# Patient Record
Sex: Female | Born: 1955 | Race: White | Hispanic: No | Marital: Single | Smoking: Former smoker
Health system: Southern US, Community
[De-identification: ages and names within clinical notes are randomized; demographics above are authoritative.]

## PROBLEM LIST (undated history)

## (undated) DIAGNOSIS — I10 Essential (primary) hypertension: Secondary | ICD-10-CM

## (undated) DIAGNOSIS — M1990 Unspecified osteoarthritis, unspecified site: Secondary | ICD-10-CM

## (undated) DIAGNOSIS — Z9289 Personal history of other medical treatment: Secondary | ICD-10-CM

## (undated) DIAGNOSIS — J309 Allergic rhinitis, unspecified: Secondary | ICD-10-CM

## (undated) DIAGNOSIS — M8580 Other specified disorders of bone density and structure, unspecified site: Secondary | ICD-10-CM

## (undated) HISTORY — DX: Allergic rhinitis, unspecified: J30.9

## (undated) HISTORY — DX: Other specified disorders of bone density and structure, unspecified site: M85.80

## (undated) HISTORY — DX: Unspecified osteoarthritis, unspecified site: M19.90

## (undated) HISTORY — DX: Personal history of other medical treatment: Z92.89

## (undated) HISTORY — PX: HAND SURGERY: SHX662

## (undated) HISTORY — PX: APPENDECTOMY: SHX54

## (undated) HISTORY — DX: Essential (primary) hypertension: I10

---

## 1980-03-31 HISTORY — PX: AUGMENTATION MAMMAPLASTY: SUR837

## 2001-03-31 HISTORY — PX: BILATERAL SALPINGOOPHORECTOMY: SHX1223

## 2001-03-31 HISTORY — PX: HYSTEROSCOPY: SHX211

## 2004-03-31 HISTORY — PX: LUMBAR DISC SURGERY: SHX700

## 2004-05-28 ENCOUNTER — Ambulatory Visit: Payer: Self-pay | Admitting: Specialist

## 2004-06-28 ENCOUNTER — Ambulatory Visit (HOSPITAL_COMMUNITY): Admission: RE | Admit: 2004-06-28 | Discharge: 2004-06-29 | Payer: Self-pay | Admitting: Neurological Surgery

## 2004-07-30 ENCOUNTER — Encounter: Admission: RE | Admit: 2004-07-30 | Discharge: 2004-07-30 | Payer: Self-pay | Admitting: Neurological Surgery

## 2004-09-03 ENCOUNTER — Encounter: Admission: RE | Admit: 2004-09-03 | Discharge: 2004-09-03 | Payer: Self-pay | Admitting: Neurological Surgery

## 2005-03-01 ENCOUNTER — Ambulatory Visit: Payer: Self-pay | Admitting: Internal Medicine

## 2005-10-27 ENCOUNTER — Ambulatory Visit: Payer: Self-pay | Admitting: Internal Medicine

## 2006-03-31 HISTORY — PX: SHOULDER ARTHROSCOPY: SHX128

## 2006-11-23 ENCOUNTER — Emergency Department: Payer: Self-pay | Admitting: Emergency Medicine

## 2006-11-23 ENCOUNTER — Other Ambulatory Visit: Payer: Self-pay

## 2006-11-24 ENCOUNTER — Ambulatory Visit: Payer: Self-pay | Admitting: Emergency Medicine

## 2006-12-21 ENCOUNTER — Encounter: Admission: RE | Admit: 2006-12-21 | Discharge: 2006-12-21 | Payer: Self-pay | Admitting: Neurological Surgery

## 2010-01-01 ENCOUNTER — Emergency Department: Payer: Self-pay | Admitting: Emergency Medicine

## 2010-06-28 ENCOUNTER — Ambulatory Visit: Payer: Self-pay | Admitting: Internal Medicine

## 2010-08-16 NOTE — Op Note (Signed)
Melody Steele, Melody Steele               ACCOUNT NO.:  [id number]   MEDICAL RECORD NO.:  [id number]          PATIENT TYPE:  OIB   LOCATION:  3011                         FACILITY:  [facility]   PHYSICIAN:  Tia Alert, MD     DATE OF BIRTH:  12/28/1955   DATE OF PROCEDURE:  06/28/2004  DATE OF DISCHARGE:                                 OPERATIVE REPORT   PREOPERATIVE DIAGNOSIS:  Cervical spondylosis with foraminal stenosis, C5-6,  with right arm pain.   POSTOPERATIVE DIAGNOSIS:  Cervical spondylosis with foraminal stenosis, C5-  6, with right arm pain.   PROCEDURES:  1.  Decompressive anterior cervical diskectomy, C5-6.  2.  Anterior cervical arthrodesis, C5-6, utilizing a 7 mm VG-2 allograft.  #.  Anterior cervical plating utilizing a 22 mm Accufix plate.   SURGEON:  Tia Alert, M.D.   ASSISTANT:  Kathaleen Maser. Pool, M.D.   ANESTHESIA:  General endotracheal.   COMPLICATIONS:  None apparent.   INDICATIONS FOR PROCEDURE:  Melody Steele is a 55 year old white female who  was referred with neck and right arm pain.  She had an MRI, which showed  cervical spondylosis with foraminal stenosis at C5-6 . She had tried medical  management for some time without significant relief.  I recommended a  decompressive anterior cervical diskectomy and fusion with plating at C5-6.  She understood the risks, benefits and alternatives and wished to proceed.   DESCRIPTION OF PROCEDURE:  The patient was taken to operating room and after  induction of adequate generalized endotracheal anesthesia, she was placed in  supine position on the operating room table.  Her right anterior cervical  region was prepped with DuraPrep and draped in the usual sterile fashion.  Local anesthesia 3 mL was injected and an incision was made to the right of  midline and carried down to the platysma, which was elevated, opened and  undermined with Metzenbaum scissors.  I then dissected in a plane medial to  the  sternocleidomastoid muscle and the internal carotid artery and lateral  to the trachea and esophagus to expose the anterior cervical spine at C5-6.  Intraoperative fluoroscopy confirmed my level and then the longus colli  muscles were taken down and the Shadow Line retractors were placed under  these.  The C5-6 annulus was incised and the initial diskectomy was done  with pituitary rongeurs and curved curettes.  I then used a drill to drill  the endplates to prepare for later arthrodesis.  I drilled down to the level  of the posterior longitudinal ligament.  The operating microscope was  brought into the field.  The posterior longitudinal ligament was opened and  removed in a circumferential fashion along with the posterior osteophytes at  C5-6 on the right side.  Her foramen was quite tight and there was a large  uncinate spur coming off the superior part of the foramen, which was  removed.  There was a small dural tear over the C6 nerve root on the right  side without leaks of spinal fluid.  The foramen was decompressed distally  into the foramen  with a wide decompression of the nerve root.  We then  dissected toward the patient's left side and performed foraminotomy on that  side also.  We used a nerve hook to palpate into the foramen and to ensure  adequate decompression of the nerve roots.  Once the decompression was  completely irrigated with saline solution containing bacitracin, I then  lined the dura with Duragen.  We then measured the interspace to be about 7  mm and used a VG-2 allograft and tapped this into position at C5-6 and then  used a 22 mm Accufix plate, placed 13 mm variable-angle screws into the  bodies of C5 andC6.  These were locked into position with the locking  mechanism on the plate.  We then checked our construct with fluoroscopy,  dried all bleeding points and one once meticulous hemostasis was achieved,  closed the platysma and subcuticular tissues with layers  of 3-0 Vicryl.  The  skin was closed with Dermabond and the drapes removed.  The patient was  awakened from general anesthesia and transferred to the recovery room in  stable condition.  At the end of the procedure, all sponge, needle and  sponge counts were correct.      DSJ/MEDQ  D:  06/28/2004  T:  06/28/2004  Job:  [id number]

## 2011-08-19 ENCOUNTER — Emergency Department: Payer: Self-pay | Admitting: Emergency Medicine

## 2011-11-30 HISTORY — PX: FOOT SURGERY: SHX648

## 2011-12-04 ENCOUNTER — Ambulatory Visit: Payer: Self-pay | Admitting: Podiatry

## 2013-01-09 ENCOUNTER — Emergency Department: Payer: Self-pay | Admitting: Emergency Medicine

## 2013-01-09 LAB — URINALYSIS, COMPLETE
Bacteria: NONE SEEN
Bilirubin,UR: NEGATIVE
Blood: NEGATIVE
Glucose,UR: NEGATIVE mg/dL (ref 0–75)
Ketone: NEGATIVE
Ph: 7 (ref 4.5–8.0)
Protein: NEGATIVE
RBC,UR: <1 /HPF (ref 0–5)
Specific Gravity: 1.003 (ref 1.003–1.030)

## 2015-07-30 HISTORY — PX: COLONOSCOPY: SHX174

## 2015-08-15 ENCOUNTER — Other Ambulatory Visit (HOSPITAL_COMMUNITY): Payer: Self-pay | Admitting: Physician Assistant

## 2015-08-15 ENCOUNTER — Other Ambulatory Visit: Payer: Self-pay | Admitting: Physician Assistant

## 2015-08-15 DIAGNOSIS — M545 Low back pain: Secondary | ICD-10-CM

## 2015-08-21 ENCOUNTER — Ambulatory Visit (HOSPITAL_COMMUNITY)
Admission: RE | Admit: 2015-08-21 | Discharge: 2015-08-21 | Disposition: A | Discharge status: Home / Self Care | Payer: 59 | Source: Ambulatory Visit | Attending: Physician Assistant | Admitting: Physician Assistant

## 2015-08-21 DIAGNOSIS — M5136 Other intervertebral disc degeneration, lumbar region: Secondary | ICD-10-CM | POA: Insufficient documentation

## 2015-08-21 DIAGNOSIS — M545 Low back pain: Secondary | ICD-10-CM | POA: Insufficient documentation

## 2015-09-04 ENCOUNTER — Ambulatory Visit: Payer: Self-pay

## 2015-10-15 ENCOUNTER — Other Ambulatory Visit: Payer: Self-pay | Admitting: Obstetrics & Gynecology

## 2015-11-26 ENCOUNTER — Other Ambulatory Visit: Payer: Self-pay | Admitting: Unknown Physician Specialty

## 2015-11-26 DIAGNOSIS — Z1231 Encounter for screening mammogram for malignant neoplasm of breast: Secondary | ICD-10-CM

## 2015-12-11 ENCOUNTER — Other Ambulatory Visit: Payer: Self-pay | Admitting: Unknown Physician Specialty

## 2015-12-11 ENCOUNTER — Ambulatory Visit
Admission: RE | Admit: 2015-12-11 | Discharge: 2015-12-11 | Disposition: A | Discharge status: Home / Self Care | Payer: 59 | Source: Ambulatory Visit | Attending: Unknown Physician Specialty | Admitting: Unknown Physician Specialty

## 2015-12-11 DIAGNOSIS — Z1231 Encounter for screening mammogram for malignant neoplasm of breast: Secondary | ICD-10-CM | POA: Diagnosis not present

## 2015-12-18 ENCOUNTER — Inpatient Hospital Stay
Admission: RE | Admit: 2015-12-18 | Discharge: 2015-12-18 | Disposition: A | Discharge status: Home / Self Care | Payer: Self-pay | Source: Ambulatory Visit | Attending: *Deleted | Admitting: *Deleted

## 2015-12-18 ENCOUNTER — Other Ambulatory Visit: Payer: Self-pay | Admitting: *Deleted

## 2015-12-18 DIAGNOSIS — Z9289 Personal history of other medical treatment: Secondary | ICD-10-CM

## 2016-03-01 ENCOUNTER — Other Ambulatory Visit (INDEPENDENT_AMBULATORY_CARE_PROVIDER_SITE_OTHER): Payer: Self-pay | Admitting: Physician Assistant

## 2016-03-03 NOTE — Telephone Encounter (Signed)
Please advise 

## 2016-07-08 ENCOUNTER — Other Ambulatory Visit (INDEPENDENT_AMBULATORY_CARE_PROVIDER_SITE_OTHER): Payer: Self-pay | Admitting: Physician Assistant

## 2016-07-08 NOTE — Telephone Encounter (Signed)
Please advise 

## 2016-11-19 ENCOUNTER — Ambulatory Visit (INDEPENDENT_AMBULATORY_CARE_PROVIDER_SITE_OTHER): Payer: 59

## 2016-11-19 ENCOUNTER — Ambulatory Visit: Payer: Self-pay | Admitting: Advanced Practice Midwife

## 2016-11-19 ENCOUNTER — Ambulatory Visit (INDEPENDENT_AMBULATORY_CARE_PROVIDER_SITE_OTHER): Payer: 59 | Admitting: Physician Assistant

## 2016-11-19 DIAGNOSIS — M542 Cervicalgia: Secondary | ICD-10-CM | POA: Diagnosis not present

## 2016-11-19 DIAGNOSIS — M25511 Pain in right shoulder: Secondary | ICD-10-CM | POA: Diagnosis not present

## 2016-11-19 DIAGNOSIS — E7800 Pure hypercholesterolemia, unspecified: Secondary | ICD-10-CM

## 2016-11-19 MED ORDER — METHYLPREDNISOLONE 4 MG PO TABS: ORAL_TABLET | ORAL | 0 refills | Status: DC

## 2016-11-19 NOTE — Progress Notes (Signed)
Office Visit Note   Patient: Melody Steele           Date of Birth: 06-09-1955           MRN: [id number] Visit Date: 11/19/2016              Requested by: Melody Shaggy, MD [address]   [place], [place] [address] PCP: Melody Shaggy, MD   Assessment & Plan: Visit Diagnoses:  1. Neck pain   2. Acute pain of right shoulder     Plan: Place her on a Medrol Dosepak. naproxen or other NSAIDs while on this. She is already on Zanaflex she can continue taking this at night. Send her to physical therapy for range of motion, neck exercises  and modalities  Follow-Up Instructions: Return in about 2 weeks (around 12/03/2016).   Orders:  Orders Placed This Encounter  Procedures  . XR Shoulder Right  . XR Cervical Spine 2 or 3 views   Meds ordered this encounter  Medications  . DISCONTD: methylPREDNISolone (MEDROL) 4 MG tablet    Sig: Take as directed    Dispense:  21 tablet    Refill:  0  . methylPREDNISolone (MEDROL) 4 MG tablet    Sig: Take as directed    Dispense:  21 tablet    Refill:  0      Procedures: No procedures performed   Clinical Data: No additional findings.   Subjective: Right shoulder pain  HPI Melody Steele report returns today with new complaint of right shoulder pain for the past month. No known injury. States she has achiness into her right shoulder and up into her neck. She has a history of a C5-C6 fusion by Dr. Yetta Steele in the past. She's having no real radicular symptoms down the arm. She does work on a computer all day and that this causes her pain to increase. She denies any decreased range of motion of the shoulder. 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  Review of Systems Denies fevers chills shortness breath chest pain. Positive for headache and neck and shoulder pain. Otherwise please see history of present illness  Objective: Vital Signs: There were no vitals taken for  this visit.  Physical Exam  Constitutional: She is oriented to person, place, and time. She appears well-developed and well-nourished. No distress.  Cardiovascular: Intact distal pulses.   Neurological: She is alert and oriented to person, place, and time.  Skin: She is not diaphoretic.  Psychiatric: She has a normal mood and affect. Her behavior is normal.    Ortho Exam Deep tendon reflexes biceps triceps brachioradialis are all equal and symmetric. Good sensation of all hands light touch. 5 out of strengths with external and internal rotation bilateral shoulders against resistance. Empty can test negative bilaterally. She has negative impingement testing bilateral shoulders. Full forward flexion bilateral shoulders. Tenderness over the medial border of the right scapula. Tenderness right trapezius region. She has good flexion and cervical spine without pain. Extension causes discomfort in the shoulder. She has positive Spurling's test. Specialty Comments:  No specialty comments available.  Imaging: Xr Cervical Spine 2 Or 3 Views  Result Date: 11/19/2016 Cervical spine 2 views: No acute fractures. Endplate spurring at see 45 and C5-C6. Status post C5-C6 fusion with no hardware failure. No acute fractures. Loss of the normal lordotic curvature. No spondylolisthesis  Xr Shoulder Right  Result Date: 11/19/2016 Right shoulder 3 views: Shoulders well located. No acute fractures no bony abnormalities. Glenohumeral joint is well maintained.    PMFS History: There are no active problems to display for this patient.  No past medical history on file.  Family History  Problem Relation Age of Onset  . Breast cancer Maternal Aunt        older than 23    Past Surgical History:  Procedure Laterality Date  . AUGMENTATION MAMMAPLASTY Bilateral 1982   Taken out 1995   Social History   Occupational History  . Not on file.   Social History Main Topics  . Smoking status: Not on file  .  Smokeless tobacco: Not on file  . Alcohol use Not on file  . Drug use: Unknown  . Sexual activity: Not on file

## 2016-11-20 ENCOUNTER — Ambulatory Visit: Payer: Self-pay | Admitting: Advanced Practice Midwife

## 2016-12-01 ENCOUNTER — Other Ambulatory Visit (INDEPENDENT_AMBULATORY_CARE_PROVIDER_SITE_OTHER): Payer: Self-pay | Admitting: Physician Assistant

## 2016-12-04 ENCOUNTER — Ambulatory Visit (INDEPENDENT_AMBULATORY_CARE_PROVIDER_SITE_OTHER): Payer: 59 | Admitting: Orthopaedic Surgery

## 2017-01-06 ENCOUNTER — Ambulatory Visit (INDEPENDENT_AMBULATORY_CARE_PROVIDER_SITE_OTHER): Payer: 59 | Admitting: Orthopaedic Surgery

## 2017-01-06 ENCOUNTER — Other Ambulatory Visit (INDEPENDENT_AMBULATORY_CARE_PROVIDER_SITE_OTHER): Payer: Self-pay

## 2017-01-06 DIAGNOSIS — M545 Low back pain, unspecified: Secondary | ICD-10-CM

## 2017-01-06 DIAGNOSIS — M542 Cervicalgia: Secondary | ICD-10-CM

## 2017-01-06 DIAGNOSIS — G8929 Other chronic pain: Secondary | ICD-10-CM

## 2017-01-06 DIAGNOSIS — M5442 Lumbago with sciatica, left side: Principal | ICD-10-CM

## 2017-01-06 DIAGNOSIS — M25511 Pain in right shoulder: Secondary | ICD-10-CM | POA: Diagnosis not present

## 2017-01-06 MED ORDER — METHYLPREDNISOLONE ACETATE 40 MG/ML IJ SUSP
40.0000 mg | INTRAMUSCULAR | Status: AC | PRN
  Administered 2017-01-06: 40 mg via INTRAMUSCULAR

## 2017-01-06 MED ORDER — LIDOCAINE HCL 1 % IJ SOLN
1.0000 mL | INTRAMUSCULAR | Status: AC | PRN
  Administered 2017-01-06: 1 mL

## 2017-01-06 NOTE — Progress Notes (Signed)
Office Visit Note   Patient: Melody Steele           Date of Birth: 1956/02/14           MRN: [id number] Visit Date: 01/06/2017              Requested by: Jaclyn Shaggy, MD [address]   [place], [place] [address] PCP: Jaclyn Shaggy, MD   Assessment & Plan: Visit Diagnoses:  1. Cervicalgia   2. Acute pain of right shoulder   3. Chronic bilateral low back pain without sciatica     Plan: I spoke with her about trigger point injection in the trapezius area on the right side and she is agreeable to this Risks and benefits of this. She'll continue therapy and we will send her for an injection by Dr. Alvester Morin in the facet joints of L5-S1 likely bilaterally. Also encouraged her to get follow-up will with her neurosurgeon who performed her cervical spine fusion to make sure that that is not the source of her problems. See her back myself in 6 weeks.  Follow-Up Instructions: Return in about 6 weeks (around 02/17/2017).   Orders:  Orders Placed This Encounter  Procedures  . Trigger Point Injection   No orders of the defined types were placed in this encounter.     Procedures: Trigger Point Inj Date/Time: 01/06/2017 10:16 AM Performed by: Kathryne Hitch Authorized by: Kathryne Hitch   Total # of Trigger Points:  1 Location: shoulder   Medications #1:  1 mL lidocaine 1 %; 40 mg methylPREDNISolone acetate 40 MG/ML     Clinical Data: No additional findings.   Subjective: No chief complaint on file. The patient is here for follow-up after physical therapy on her neck and shoulder as well as a steroid taper and a muscle relaxant. She still having pain in her trapezius and parascapular area. She's had a previous C5-C6 fusion by one of the neurosurgeons in town. She still having pain in the same areas. It radiates down his right hand with a numbness and tingling she is is an aching pain. She also has low back pain. She had previous left-sided L5-S1 facet  injection by Dr. Alvester Morin last year and like to consider injection again and her facet joint and she points the left and right side of her low back source of pain.  HPI  Review of Systems She denies a headache, chest pain, shortness of breath, fever, chills, nausea, vomiting.  Objective: Vital Signs: There were no vitals taken for this visit.  Physical Exam She is alert or 3 and in no acute distress Ortho Exam Examination of her left shoulder and right shoulder shows fluid range of motion of both shoulders at all. Her pain is in the trapezius and parascapular area and it radiates in to the neck. She has normal motor and sensory function of the right upper extremity. The parascapular pain is on the right side and the trapezius pain is also the right side. It seems me or consistent with a trigger point. Her shoulder function right side is normal. Her low back shows pain only paraspinal muscles to the left and right of the lowers aspect lumbar spine. Specialty Comments:  No specialty comments available.  Imaging: No results found.   PMFS History: Patient Active Problem List   Diagnosis Date Noted  . Cervicalgia 01/06/2017  . Acute pain of right shoulder 01/06/2017  . Hypercholesterolemia 11/19/2016   No past medical  history on file.  Family History  Problem Relation Age of Onset  . Breast cancer Maternal Aunt        older than 17    Past Surgical History:  Procedure Laterality Date  . AUGMENTATION MAMMAPLASTY Bilateral 1982   Taken out 1995   Social History   Occupational History  . Not on file.   Social History Main Topics  . Smoking status: Not on file  . Smokeless tobacco: Not on file  . Alcohol use Not on file  . Drug use: Unknown  . Sexual activity: Not on file

## 2017-01-16 ENCOUNTER — Other Ambulatory Visit: Payer: Self-pay | Admitting: Internal Medicine

## 2017-01-16 DIAGNOSIS — Z1231 Encounter for screening mammogram for malignant neoplasm of breast: Secondary | ICD-10-CM

## 2017-01-19 ENCOUNTER — Ambulatory Visit (INDEPENDENT_AMBULATORY_CARE_PROVIDER_SITE_OTHER): Payer: Self-pay

## 2017-01-19 ENCOUNTER — Encounter (INDEPENDENT_AMBULATORY_CARE_PROVIDER_SITE_OTHER): Payer: Self-pay | Admitting: Physical Medicine and Rehabilitation

## 2017-01-19 ENCOUNTER — Ambulatory Visit (INDEPENDENT_AMBULATORY_CARE_PROVIDER_SITE_OTHER): Payer: 59 | Admitting: Physical Medicine and Rehabilitation

## 2017-01-19 VITALS — BP 157/89 | HR 75

## 2017-01-19 DIAGNOSIS — M545 Low back pain, unspecified: Secondary | ICD-10-CM

## 2017-01-19 DIAGNOSIS — M47816 Spondylosis without myelopathy or radiculopathy, lumbar region: Secondary | ICD-10-CM

## 2017-01-19 DIAGNOSIS — G8929 Other chronic pain: Secondary | ICD-10-CM

## 2017-01-19 MED ORDER — METHYLPREDNISOLONE ACETATE 80 MG/ML IJ SUSP
80.0000 mg | Freq: Once | INTRAMUSCULAR | Status: AC
  Administered 2017-01-19: 80 mg

## 2017-01-19 MED ORDER — LIDOCAINE HCL (PF) 1 % IJ SOLN
2.0000 mL | Freq: Once | INTRAMUSCULAR | Status: AC
  Administered 2017-01-19: 2 mL

## 2017-01-19 NOTE — Patient Instructions (Signed)

## 2017-01-19 NOTE — Progress Notes (Deleted)
Acute R shoulder pain & Chronic bilateral low back pain

## 2017-01-20 NOTE — Procedures (Signed)
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 MRI from 2017. She has a partially lumbarized S1 segment. She is having mostly right sided symptoms now at the lumbosacral junction. We are going to complete a right L5-S1 facet joint block. She talked to me at length today about her right shoulder pain. She has good range of motion of the right shoulder without impingement. Dr. Vedia CofferBlack may completed trigger point injection which has not helped her. She's had dry needling in the past in physical [place] [facility] and this is not really helped either. I have suggested like Dr. Rayburn MaBlackmon that she follow up with her neurosurgeon about her cervical spine. She does have a focal trigger point that I can palpate today in the supraspinatus muscle. At some point depending on her evaluation be happy to look at her for trying trigger point injection as well.  Lumbar Facet Joint Intra-Articular Injection(s) with Fluoroscopic Guidance  Patient: Melody EpleyDonna B Steele      Date of Birth: 22-Aug-1955 MRN: [id number] PCP: Jaclyn Shaggyate, Denny C, MD      Visit Date: 01/19/2017   Universal Protocol:    Date/Time: 01/19/2017  Consent Given By: the patient  Position: PRONE   Additional Comments: Vital signs were monitored before and after the procedure. Patient was prepped and draped in the usual sterile fashion. The correct patient, procedure, and site was verified.   Injection Procedure Details:  Procedure Site One Meds Administered:  Meds ordered this encounter  Medications  . lidocaine (PF) (XYLOCAINE) 1 % injection 2 mL  . methylPREDNISolone acetate (DEPO-MEDROL) injection 80 mg     Laterality: Right  Location/Site:  L5-S1  Needle size: 22 guage  Needle type: Spinal  Needle Placement: Articular  Findings:  -Contrast Used: 0.5 mL iohexol 180 mg iodine/mL    -Comments: Excellent flow of contrast producing a partial arthrogram.  Procedure Details: The fluoroscope beam is vertically oriented in AP, and the inferior recess is visualized beneath the lower pole of the inferior apophyseal process, which represents the target point for needle insertion. When direct visualization is difficult the target point is located at the medial projection of the vertebral pedicle. The region overlying each aforementioned target is locally anesthetized with a 1 to 2 ml. volume of 1% Lidocaine without Epinephrine.   The spinal needle was inserted into each of the above mentioned facet joints using biplanar fluoroscopic guidance. A 0.25 to 0.5 ml. volume of Isovue-250 was injected and a partial facet joint arthrogram was obtained. A single spot film was obtained of the resulting arthrogram.    One to 1.25 ml of the steroid/anesthetic solution was then injected into each of the facet joints noted above.   Additional Comments:  The patient tolerated the procedure well Dressing: Band-Aid    Post-procedure details: Patient was observed during the procedure. Post-procedure instructions were reviewed.  Patient left the clinic in stable condition.

## 2017-02-03 ENCOUNTER — Encounter: Payer: Self-pay | Admitting: Maternal Newborn

## 2017-02-03 ENCOUNTER — Ambulatory Visit (INDEPENDENT_AMBULATORY_CARE_PROVIDER_SITE_OTHER): Payer: 59 | Admitting: Maternal Newborn

## 2017-02-03 ENCOUNTER — Ambulatory Visit
Admission: RE | Admit: 2017-02-03 | Discharge: 2017-02-03 | Disposition: A | Discharge status: Home / Self Care | Payer: 59 | Source: Ambulatory Visit | Attending: Internal Medicine | Admitting: Internal Medicine

## 2017-02-03 VITALS — BP 120/80 | HR 66 | Ht 64.0 in | Wt 131.0 lb

## 2017-02-03 DIAGNOSIS — Z1231 Encounter for screening mammogram for malignant neoplasm of breast: Secondary | ICD-10-CM | POA: Diagnosis present

## 2017-02-03 DIAGNOSIS — Z01419 Encounter for gynecological examination (general) (routine) without abnormal findings: Secondary | ICD-10-CM

## 2017-02-03 DIAGNOSIS — Z1272 Encounter for screening for malignant neoplasm of vagina: Secondary | ICD-10-CM

## 2017-02-03 NOTE — Progress Notes (Signed)
Gynecology Annual Exam  PCP: Jaclyn Shaggyate, Denny C, MD  Chief Complaint:  Chief Complaint  Patient presents with  . Gynecologic Exam    History of Present Illness:Patient is a 61 y.o. G2P1102 presents for annual exam. The patient has no complaints today.   LMP: No LMP recorded. Patient has had a hysterectomy. Postmenopausual Bleeding: no Postcoital Bleeding: not applicable  The patient is not currently sexually active. She denies dyspareunia.  The patient does not perform self breast exams.  There is notable family history of breast or ovarian cancer in her family.  The patient wears seatbelts: yes.   The patient has regular exercise: yes.    The patient denies current symptoms of depression.     Review of Systems  Constitutional: Negative for fever and weight loss.  HENT: Negative for congestion, hearing loss, sinus pain and sore throat.   Eyes: Negative for blurred vision, discharge and redness.  Respiratory: Negative for cough, shortness of breath and wheezing.   Cardiovascular: Negative for chest pain and palpitations.  Gastrointestinal: Negative for constipation, diarrhea, heartburn and nausea.  Genitourinary: Positive for frequency. Negative for dysuria, flank pain and urgency.  Musculoskeletal: Positive for back pain and neck pain.  Skin: Negative.   Neurological: Negative for dizziness, tingling, weakness and headaches.  Endo/Heme/Allergies: Negative.   Psychiatric/Behavioral: Negative for depression. The patient is not nervous/anxious.   All other systems reviewed and are negative.   Past Medical History:  Past Medical History:  Diagnosis Date  . Allergic rhinitis   . Arthritis   . History of mammogram 2011; 08/29/13; 09/12/14   bibc neg; neg; neg  . History of Papanicolaou smear of cervix 04/29/11; 08/29/13   -/-; -/-  . Osteoarthritis   . Osteopenia    -2.46 z score    Past Surgical History:  Past Surgical History:  Procedure Laterality Date  . APPENDECTOMY      . AUGMENTATION MAMMAPLASTY Bilateral 1982   Taken out 1995  . BILATERAL SALPINGOOPHORECTOMY  2003  . COLONOSCOPY  07/2015  . FOOT SURGERY  11/2011  . HAND SURGERY     thumb  . HYSTEROSCOPY  2003   [facility]  . LUMBAR DISC SURGERY  2006  . SHOULDER ARTHROSCOPY  2008   SCRAPED BURSAL SAC    Gynecologic History:  No LMP recorded. Patient has had a hysterectomy. Last Pap: 08/29/2013.  Results were: NIL and HR HPV negative  Last mammogram: 12/11/2015. Results were: BI-RAD I Obstetric History: [id number]  Family History:  Family History  Problem Relation Age of Onset  . Breast cancer Maternal Aunt 2155       older than 6384  . Heart disease Mother   . Hypertension Mother   . Stroke Mother     Social History:  Social History   Socioeconomic History  . Marital status: Single    Spouse name: Not on file  . Number of children: Not on file  . Years of education: Not on file  . Highest education level: Not on file  Social Needs  . Financial resource strain: Not on file  . Food insecurity - worry: Not on file  . Food insecurity - inability: Not on file  . Transportation needs - medical: Not on file  . Transportation needs - non-medical: Not on file  Occupational History  . Not on file  Tobacco Use  . Smoking status: Former Games developermoker  . Smokeless tobacco: Never Used  . Tobacco comment: QUIT DATE:2003  Substance and Sexual  Activity  . Alcohol use: No    Frequency: Never  . Drug use: No  . Sexual activity: No    Birth control/protection: Surgical  Other Topics Concern  . Not on file  Social History Narrative  . Not on file    Allergies:  Allergies  Allergen Reactions  . Codeine Nausea Only and Nausea And Vomiting    Medications: Prior to Admission medications   Medication Sig Start Date End Date Taking? Authorizing Provider  atorvastatin (LIPITOR) 20 MG tablet  10/13/16  Yes [provider]  Calcium Carbonate-Vitamin D 600-400 MG-UNIT tablet Take 1 tablet daily by  mouth.   Yes [provider]  naproxen (NAPROSYN) 500 MG tablet Take 500 mg 2 (two) times daily with a meal by mouth.   Yes [provider]  tiZANidine (ZANAFLEX) 4 MG tablet as directed. ONE TAB QHS AND 1/2 IN AM 07/27/14  Yes [provider]  valACYclovir (VALTREX) 500 MG tablet Take 500 mg as needed by mouth. 11/25/16  Yes [provider]    Physical Exam Vitals: Blood pressure 120/80, pulse 66, height 5\' 4"  (1.626 m), weight 131 lb (59.4 kg).  General: NAD HEENT: normocephalic, anicteric Thyroid: no enlargement, no palpable nodules Pulmonary: No increased work of breathing, CTAB Cardiovascular: RRR, distal pulses 2+ Breast: Breasts symmetrical, no tenderness, no palpable nodules or masses, no skin or nipple retraction present, no nipple discharge.  No axillary or supraclavicular lymphadenopathy. Abdomen: NABS, soft, non-tender, non-distended.  Umbilicus without lesions.  No hepatomegaly, splenomegaly or masses palpable. No evidence of hernia  Genitourinary:  External: Normal external female genitalia.  Normal urethral meatus, normal  Bartholin's and Skene's glands.    Vagina: Normal vaginal mucosa, no evidence of prolapse.    Cervix: Unable to visualize  Uterus: Surgically absent  Adnexa: Ovaries surgically absent  Rectal: deferred  Lymphatic: no evidence of inguinal lymphadenopathy Extremities: no edema, erythema, or tenderness Neurologic: Grossly intact Psychiatric: mood appropriate, affect full   Assessment: 61 y.o. No obstetric history on file. routine annual exam  Plan: Problem List Items Addressed This Visit    None    Visit Diagnoses    Encounter for annual routine gynecological examination    -  Primary   Pap smear of vagina following gynecologic surgery       Relevant Orders   Pap IG and HPV (high risk) DNA detection      1) Mammogram - recommend yearly screening mammogram.  Mammogram was previously ordered and patient has  appointment today.  2) STI screening was offered and declined.  3) ASCCP guidelines and rationale discussed.  Patient opts for every 3 year screening interval. Discussed that she may opt to stop screening at age 61 with no abnormal Paps.  4) Osteoporosis: prior scan showed osteopenia, next DEXA at age 61.  5) Routine healthcare maintenance including cholesterol, diabetes screening discussed: managed by PCP.  6) Colonoscopy in 2016, next in 2026.  7) Follow up 1 year for routine annual.  Marcelyn BruinsJacelyn Schmid, CNM 02/03/2017  4:41 PM

## 2017-02-05 LAB — PAP IG AND HPV HIGH-RISK
HPV, HIGH-RISK: NEGATIVE
PAP Smear Comment: 0

## 2017-02-16 ENCOUNTER — Ambulatory Visit (INDEPENDENT_AMBULATORY_CARE_PROVIDER_SITE_OTHER): Payer: 59 | Admitting: Orthopaedic Surgery

## 2017-06-23 ENCOUNTER — Other Ambulatory Visit (INDEPENDENT_AMBULATORY_CARE_PROVIDER_SITE_OTHER): Payer: Self-pay | Admitting: Orthopaedic Surgery

## 2017-09-20 ENCOUNTER — Other Ambulatory Visit (INDEPENDENT_AMBULATORY_CARE_PROVIDER_SITE_OTHER): Payer: Self-pay | Admitting: Orthopaedic Surgery

## 2017-12-07 ENCOUNTER — Encounter (INDEPENDENT_AMBULATORY_CARE_PROVIDER_SITE_OTHER): Payer: Self-pay | Admitting: Physician Assistant

## 2017-12-07 ENCOUNTER — Ambulatory Visit (INDEPENDENT_AMBULATORY_CARE_PROVIDER_SITE_OTHER): Payer: Managed Care, Other (non HMO) | Admitting: Physician Assistant

## 2017-12-07 ENCOUNTER — Ambulatory Visit (INDEPENDENT_AMBULATORY_CARE_PROVIDER_SITE_OTHER): Payer: Managed Care, Other (non HMO)

## 2017-12-07 VITALS — Ht 64.0 in | Wt 128.0 lb

## 2017-12-07 DIAGNOSIS — M545 Low back pain: Secondary | ICD-10-CM

## 2017-12-07 DIAGNOSIS — G8929 Other chronic pain: Secondary | ICD-10-CM

## 2017-12-07 DIAGNOSIS — M5441 Lumbago with sciatica, right side: Secondary | ICD-10-CM | POA: Diagnosis not present

## 2017-12-07 MED ORDER — METHYLPREDNISOLONE 4 MG PO TABS: ORAL_TABLET | ORAL | 0 refills | Status: DC

## 2017-12-07 NOTE — Progress Notes (Signed)
Office Visit Note   Patient: Melody Steele           Date of Birth: March 12, 1956           MRN: [id number] Visit Date: 12/07/2017              Requested by: Jaclyn Shaggy, MD [address]   [place], [place] [address] PCP: Jaclyn Shaggy, MD   Assessment & Plan: Visit Diagnoses:  1. Chronic bilateral low back pain, with sciatica presence unspecified   2. Acute right-sided low back pain with right-sided sciatica     Plan: Send her to physical therapy for back exercises home exercise program and modalities.  She is placed on Medrol Dosepak.  She will follow-up with [place] in 4 weeks check her progress lack of.  She continues to have radicular symptoms down the right leg would recommend MRI due to the fact that this change from her previous symptoms that she has had in her low back.  Follow-Up Instructions: Return in about 4 weeks (around 01/04/2018).   Orders:  Orders Placed This Encounter  Procedures  . XR Lumbar Spine 2-3 Views  . Ambulatory referral to Physical Therapy   Meds ordered this encounter  Medications  . methylPREDNISolone (MEDROL) 4 MG tablet    Sig: Take as directed    Dispense:  21 tablet    Refill:  0      Procedures: No procedures performed   Clinical Data: No additional findings.   Subjective: Chief Complaint  Patient presents with  . Lower Back - Pain  . Right Hip - Pain    HPI Melody Steele is a 62 year old female who comes in today with low back pain.  She is been seen in the past for low back pain actually underwent right L5-S1 facet injection by Dr. Alvester Morin in the past and did well.  However for the last couple of months she has had low back pain and now new onset of radicular pain down the right leg.  She has pain posterior aspect of the right leg down to the knee.  She denies any injury.  No bowel bladder dysfunction.  She denies any fevers chills abdomen pain no dysuria.  She is tried naproxen without any relief.  She denies any numbness  tingling down the leg.  Pain keeps her awake at night.  Pain is worse with sitting for prolonged period of time and when lying down at night. Review of Systems Please see HPI otherwise negative  Objective: Vital Signs: Ht 5\' 4"  (1.626 m)   Wt 128 lb (58.1 kg)   BMI 21.97 kg/m   Physical Exam  Constitutional: She is oriented to person, place, and time. She appears well-developed and well-nourished. No distress.  Cardiovascular: Intact distal pulses.  Pulmonary/Chest: Effort normal.  Neurological: She is alert and oriented to person, place, and time.  Skin: She is not diaphoretic.  Psychiatric: She has a normal mood and affect.    Ortho Exam Lumbar spine and also in the right paraspinous region.  She has full forward flexion extension without pain.  Positive straight leg raise on the right negative on the left.  Good range of motion bilateral hips without pain.  5 out of 5 strength throughout the lower extremities against resistance.  Deep tendon reflexes are 2+ at the knees and equal and symmetric 1+ at the ankles and equal and symmetric. Specialty Comments:  No specialty comments available.  Imaging: Xr Lumbar  Spine 2-3 Views  Result Date: 12/07/2017 AP lateral views lumbar spine shows a slight spinal listhesis L1 posterior on L2.  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  Disc space otherwise well-maintained.  No acute fractures no bony abnormalities.    PMFS History: Patient Active Problem List   Diagnosis Date Noted  . Cervicalgia 01/06/2017  . Acute pain of right shoulder 01/06/2017  . Hypercholesterolemia 11/19/2016   Past Medical History:  Diagnosis Date  . Allergic rhinitis   . Arthritis   . History of mammogram 2011; 08/29/13; 09/12/14   bibc neg; neg; neg  . History of Papanicolaou smear of cervix 04/29/11; 08/29/13   -/-; -/-  . Osteoarthritis   . Osteopenia    -2.46 z score    Family History  Problem Relation Age of Onset  . Breast cancer Maternal Aunt 47         older than 97  . Heart disease Mother   . Hypertension Mother   . Stroke Mother     Past Surgical History:  Procedure Laterality Date  . APPENDECTOMY    . AUGMENTATION MAMMAPLASTY Bilateral 1982   Taken out 1995  . BILATERAL SALPINGOOPHORECTOMY  2003  . COLONOSCOPY  07/2015  . FOOT SURGERY  11/2011  . HAND SURGERY     thumb  . HYSTEROSCOPY  2003   LSH  . LUMBAR DISC SURGERY  2006  . SHOULDER ARTHROSCOPY  2008   SCRAPED BURSAL SAC   Social History   Occupational History  . Not on file  Tobacco Use  . Smoking status: Former Games developer  . Smokeless tobacco: Never Used  . Tobacco comment: QUIT DATE:2003  Substance and Sexual Activity  . Alcohol use: No    Frequency: Never  . Drug use: No  . Sexual activity: Never    Birth control/protection: Surgical

## 2018-01-04 ENCOUNTER — Ambulatory Visit (INDEPENDENT_AMBULATORY_CARE_PROVIDER_SITE_OTHER): Payer: Managed Care, Other (non HMO) | Admitting: Physician Assistant

## 2018-01-14 ENCOUNTER — Other Ambulatory Visit (INDEPENDENT_AMBULATORY_CARE_PROVIDER_SITE_OTHER): Payer: Self-pay | Admitting: Orthopaedic Surgery

## 2018-02-12 ENCOUNTER — Ambulatory Visit (INDEPENDENT_AMBULATORY_CARE_PROVIDER_SITE_OTHER): Payer: Managed Care, Other (non HMO) | Admitting: Maternal Newborn

## 2018-02-12 ENCOUNTER — Other Ambulatory Visit (HOSPITAL_COMMUNITY)
Admission: RE | Admit: 2018-02-12 | Discharge: 2018-02-12 | Disposition: A | Discharge status: Home / Self Care | Payer: Managed Care, Other (non HMO) | Source: Ambulatory Visit | Attending: Maternal Newborn | Admitting: Maternal Newborn

## 2018-02-12 ENCOUNTER — Encounter: Payer: Self-pay | Admitting: Maternal Newborn

## 2018-02-12 VITALS — BP 150/80 | HR 84 | Ht 64.0 in | Wt 133.0 lb

## 2018-02-12 DIAGNOSIS — Z124 Encounter for screening for malignant neoplasm of cervix: Secondary | ICD-10-CM

## 2018-02-12 DIAGNOSIS — R102 Pelvic and perineal pain: Secondary | ICD-10-CM

## 2018-02-12 DIAGNOSIS — Z01419 Encounter for gynecological examination (general) (routine) without abnormal findings: Secondary | ICD-10-CM

## 2018-02-12 DIAGNOSIS — Z1231 Encounter for screening mammogram for malignant neoplasm of breast: Secondary | ICD-10-CM

## 2018-02-12 DIAGNOSIS — R1024 Suprapubic pain: Secondary | ICD-10-CM

## 2018-02-12 NOTE — Progress Notes (Signed)
Gynecology Annual Exam  PCP: Jaclyn Shaggy, MD  Chief Complaint:  Chief Complaint  Patient presents with  . Gynecologic Exam    pressure like she needs to void but doesn't have frequency; cough x1wk    History of Present Illness:Patient is a 62 y.o. [id number] presenting for an annual exam.   LMP: No LMP recorded. Patient has had a hysterectomy.  The patient is not currently sexually active.  The patient does not perform self breast exams.  There is a family history of breast cancer in her maternal aunt. There is no other notable family history of breast or ovarian cancer in her family.  The patient wears seatbelts: yes.   The patient has regular exercise: no.    The patient denies current symptoms of depression.     Review of Systems  Constitutional: Negative.   HENT: Negative.   Eyes: Negative.   Respiratory: Positive for cough. Negative for shortness of breath and wheezing.   Cardiovascular: Negative for chest pain and palpitations.  Gastrointestinal: Negative for abdominal pain and nausea.  Genitourinary:       Suprapubic pressure  Musculoskeletal: Positive for joint pain.  Skin: Negative.   Neurological: Negative.   Endo/Heme/Allergies: Positive for environmental allergies.  Psychiatric/Behavioral: Negative.   All other systems reviewed and are negative.   Past Medical History:  Past Medical History:  Diagnosis Date  . Allergic rhinitis   . Arthritis   . History of mammogram 2011; 08/29/13; 09/12/14   bibc neg; neg; neg  . History of Papanicolaou smear of cervix 04/29/11; 08/29/13   -/-; -/-  . Hypertension   . Osteoarthritis   . Osteopenia    -2.46 z score    Past Surgical History:  Past Surgical History:  Procedure Laterality Date  . APPENDECTOMY    . AUGMENTATION MAMMAPLASTY Bilateral 1982   Taken out 1995  . BILATERAL SALPINGOOPHORECTOMY  2003  . COLONOSCOPY  07/2015  . FOOT SURGERY  11/2011  . HAND SURGERY     thumb  . HYSTEROSCOPY  2003   LSH    . LUMBAR DISC SURGERY  2006  . SHOULDER ARTHROSCOPY  2008   SCRAPED BURSAL SAC    Gynecologic History:  No LMP recorded. Patient has had a hysterectomy. Last Pap: 02/03/2017. Results were:  NIL and HR HPV negative  Last mammogram: 02/03/2017. Results were: BI-RAD I Obstetric History: [id number]  Family History:  Family History  Problem Relation Age of Onset  . Breast cancer Maternal Aunt 41       older than 98  . Heart disease Mother   . Hypertension Mother   . Stroke Mother     Social History:  Social History   Socioeconomic History  . Marital status: Single    Spouse name: Not on file  . Number of children: 2  . Years of education: 45  . Highest education level: Not on file  Occupational History  . 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 Financial resource strain: Not on file  . Food insecurity:    Worry: Not on file    Inability: Not on file  . Transportation needs:    Medical: Not on file    Non-medical: Not on file  Tobacco Use  . Smoking status: Former Games developer  . Smokeless tobacco: Never Used  . Tobacco comment: QUIT DATE:2003  Substance and Sexual Activity  . Alcohol use: No    Frequency: Never  .  Drug use: No  . Sexual activity: Never    Birth control/protection: Surgical  Lifestyle  . Physical activity:    Days per week: Not on file    Minutes per session: Not on file  . Stress: Not on file  Relationships  . Social connections:    Talks on phone: Not on file    Gets together: Not on file    Attends religious service: Not on file    Active member of club or organization: Not on file    Attends meetings of clubs or organizations: Not on file    Relationship status: Not on file  . Intimate partner violence:    Fear of current or ex partner: Not on file    Emotionally abused: Not on file    Physically abused: Not on file    Forced sexual activity: Not on file  Other Topics Concern  . Not on file  Social History Narrative   . Not on file    Allergies:  Allergies  Allergen Reactions  . Codeine Nausea Only and Nausea And Vomiting    Medications: Prior to Admission medications   Medication Sig Start Date End Date Taking? Authorizing Provider  amLODipine (NORVASC) 2.5 MG tablet Take 2.5 mg by mouth daily.   Yes [provider]  atorvastatin (LIPITOR) 20 MG tablet  10/13/16  Yes [provider]  Calcium Carbonate-Vitamin D 600-400 MG-UNIT tablet Take 1 tablet daily by mouth.   Yes [provider]  naproxen (NAPROSYN) 500 MG tablet TAKE 1 TABLET BY MOUTH TWO  TIMES DAILY AFTER BREAKFAST AND DINNER 01/14/18  Yes Kathryne HitchBlackman, Christopher Y, MD  tiZANidine (ZANAFLEX) 4 MG tablet as directed. ONE TAB QHS AND 1/2 IN AM 07/27/14  Yes [provider]  valACYclovir (VALTREX) 500 MG tablet Take 500 mg as needed by mouth. 11/25/16  Yes [provider]    Physical Exam Vitals: Blood pressure (!) 150/80, pulse 84, height 5\' 4"  (1.626 m), weight 133 lb (60.3 kg).  General: NAD HEENT: normocephalic, anicteric Thyroid: no enlargement, no palpable nodules Pulmonary: No increased work of breathing, CTAB Cardiovascular: RRR, no murmurs, rubs, or gallops Breast: Breasts symmetrical, no tenderness, no palpable nodules or masses, no skin or nipple retraction present, no nipple discharge.  No axillary or supraclavicular lymphadenopathy. Abdomen: Soft, non-tender, non-distended.  Umbilicus without lesions.  No hepatomegaly, splenomegaly or masses palpable. No evidence of hernia  Genitourinary:  External: Normal external female genitalia.  Normal urethral  meatus, normal Bartholin's and Skene's glands.    Vagina: Normal vaginal mucosa, no evidence of prolapse.    Cervix: Grossly normal in appearance, no bleeding  Uterus: Surgically absent  Adnexa: Ovaries surgically absent  Rectal: deferred  Lymphatic: no evidence of inguinal lymphadenopathy Extremities: no edema, erythema, or  tenderness Neurologic: Grossly intact Psychiatric: mood appropriate, affect full   Assessment: 62 y.o. [id number] here for routine annual exam.  Plan: Problem List Items Addressed This Visit    None    Visit Diagnoses    Women's annual routine gynecological examination    -  Primary   Breast cancer screening by mammogram       Relevant Orders   MM 3D SCREEN BREAST BILATERAL   Suprapubic pressure       Relevant Orders   Urine Culture (Completed)   Pap smear for cervical cancer screening       Relevant Orders   Cytology - PAP      1) Mammogram - recommend yearly screening  mammogram.  Mammogram was ordered today.  2) STI screening was offered and declined.  3) ASCCP guidelines and rationale discussed.  Patient opts for yearly screening interval.  4) Osteoporosis  - has had one scan showing osteopenia, next DEXA at age 67.  5) Routine healthcare maintenance including cholesterol, diabetes screening managed by PCP.  6) Colonoscopy done in 2016; next due in 2026.  7) Urine culture for symptom of suprapubic pressure and feeling that she needs to void even after emptying her bladder.  8) Follow up 1 year for routine annual.  Marcelyn Bruins, CNM 02/12/2018

## 2018-02-14 ENCOUNTER — Encounter: Payer: Self-pay | Admitting: Maternal Newborn

## 2018-02-14 LAB — URINE CULTURE

## 2018-02-17 LAB — CYTOLOGY - PAP
Diagnosis: NEGATIVE
HPV: NOT DETECTED

## 2018-03-09 ENCOUNTER — Ambulatory Visit
Admission: RE | Admit: 2018-03-09 | Discharge: 2018-03-09 | Disposition: A | Discharge status: Home / Self Care | Payer: Managed Care, Other (non HMO) | Source: Ambulatory Visit | Attending: Maternal Newborn | Admitting: Maternal Newborn

## 2018-03-09 DIAGNOSIS — Z1231 Encounter for screening mammogram for malignant neoplasm of breast: Secondary | ICD-10-CM | POA: Insufficient documentation

## 2018-05-11 ENCOUNTER — Other Ambulatory Visit (INDEPENDENT_AMBULATORY_CARE_PROVIDER_SITE_OTHER): Payer: Self-pay | Admitting: Orthopaedic Surgery

## 2018-06-28 ENCOUNTER — Other Ambulatory Visit (INDEPENDENT_AMBULATORY_CARE_PROVIDER_SITE_OTHER): Payer: Self-pay

## 2018-06-28 MED ORDER — NAPROXEN 500 MG PO TABS: ORAL_TABLET | ORAL | 0 refills | Status: DC

## 2018-08-13 ENCOUNTER — Telehealth: Payer: Self-pay | Admitting: *Deleted

## 2018-08-13 ENCOUNTER — Other Ambulatory Visit: Payer: Managed Care, Other (non HMO)

## 2018-08-13 DIAGNOSIS — Z20822 Contact with and (suspected) exposure to covid-19: Secondary | ICD-10-CM

## 2018-08-13 LAB — NOVEL CORONAVIRUS, NAA: SARS-CoV-2, NAA: NOT DETECTED

## 2018-08-13 NOTE — Telephone Encounter (Signed)
 Health Dept calling to request testing for patient:  Melody Steele Oct 12, 2055  Reason for testing: symptomatic as of last night, sore throat, cough chills, fever, diarrhea  Insurance: [employer] employee

## 2018-08-16 LAB — NOVEL CORONAVIRUS, NAA: SARS-CoV-2, NAA: NOT DETECTED

## 2018-09-06 ENCOUNTER — Other Ambulatory Visit: Payer: Self-pay

## 2018-09-06 MED ORDER — NAPROXEN 500 MG PO TABS: ORAL_TABLET | ORAL | 0 refills | Status: DC

## 2018-10-20 ENCOUNTER — Other Ambulatory Visit: Payer: Self-pay

## 2018-10-20 DIAGNOSIS — Z20822 Contact with and (suspected) exposure to covid-19: Secondary | ICD-10-CM

## 2018-10-23 ENCOUNTER — Telehealth: Payer: Self-pay

## 2018-10-23 NOTE — Telephone Encounter (Signed)
Pt asking for results. Pt informed results are not back yet.

## 2018-10-24 LAB — NOVEL CORONAVIRUS, NAA: SARS-CoV-2, NAA: NOT DETECTED

## 2018-10-27 ENCOUNTER — Telehealth: Payer: Self-pay | Admitting: General Practice

## 2018-10-27 NOTE — Telephone Encounter (Signed)
Pt called and made aware of negative results. Expressed understanding.

## 2018-12-15 IMAGING — MG MM DIGITAL SCREENING BILAT W/ TOMO W/ CAD
9 of 13 series · 9 of 29 positions shown · non-contrast
Comparison: Previous exam(s).

CLINICAL DATA: Screening.

EXAM:
2D DIGITAL SCREENING BILATERAL MAMMOGRAM WITH CAD AND ADJUNCT TOMO

[L MLO (1 of 2)]
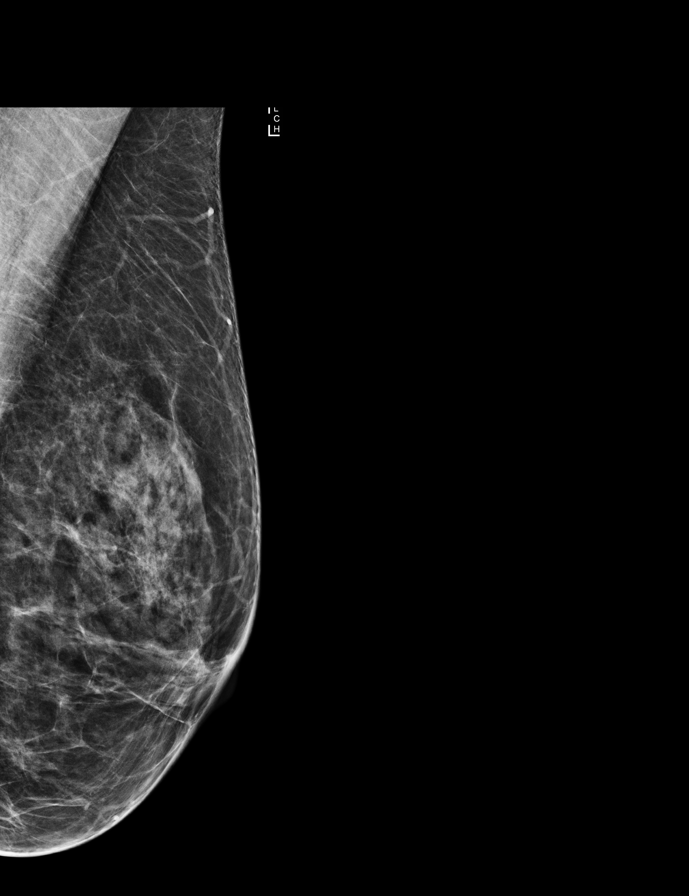

[R MLO]
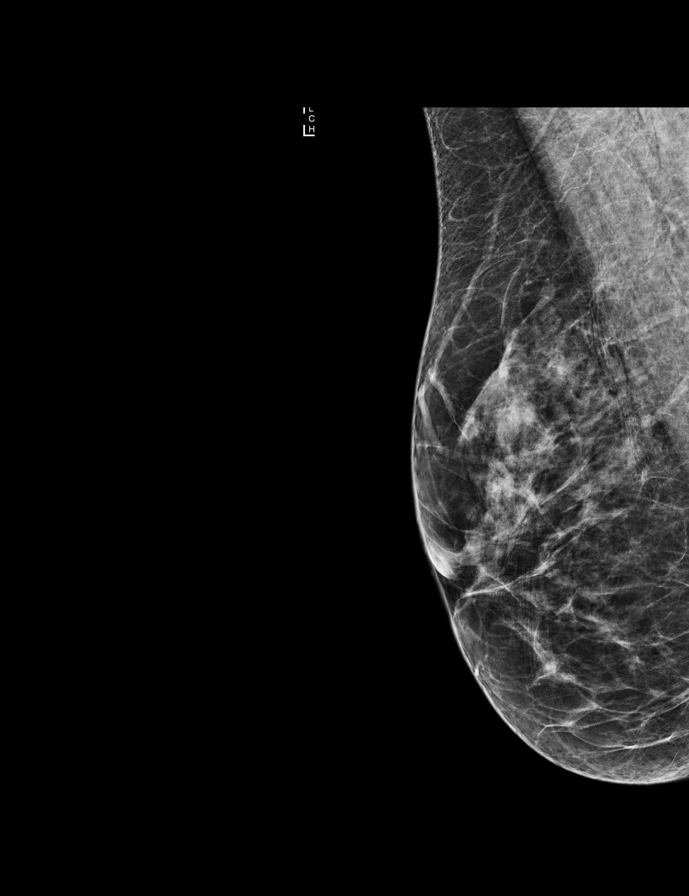

[R CC synth-2D]
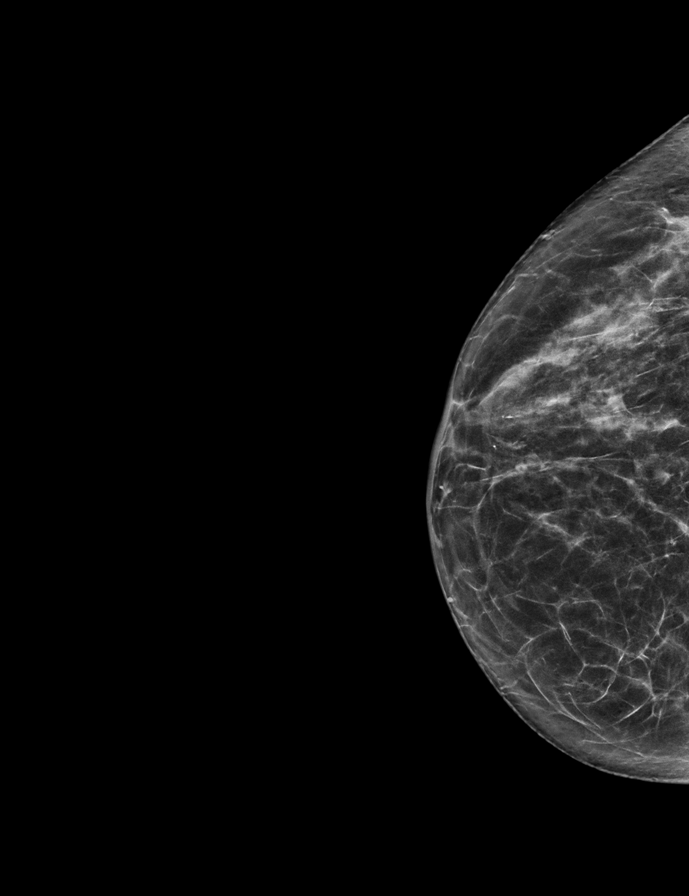

[R MLO synth-2D]
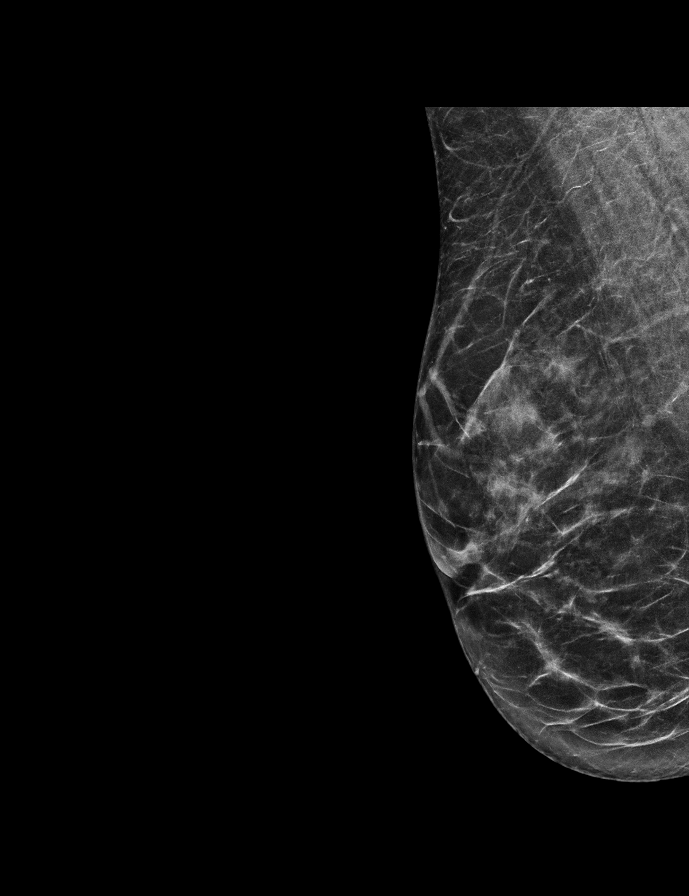

[L MLO (2 of 2)]
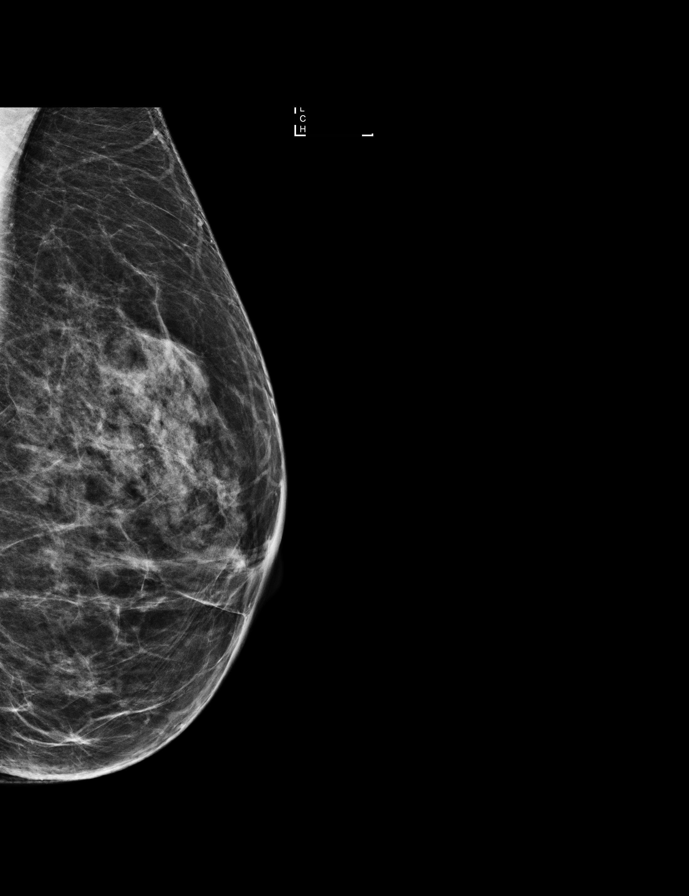

[L CC synth-2D]
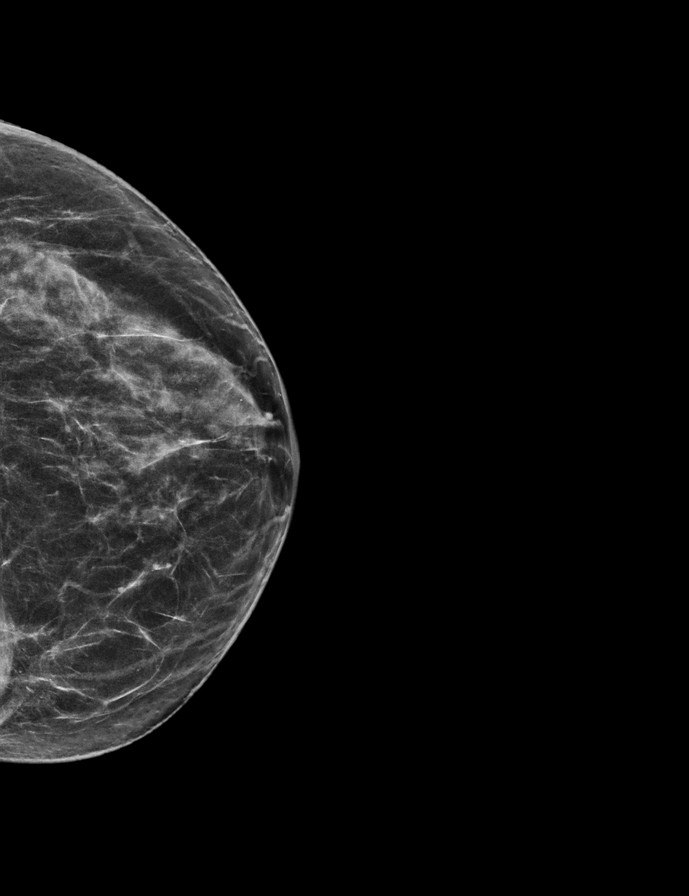

[L MLO synth-2D]
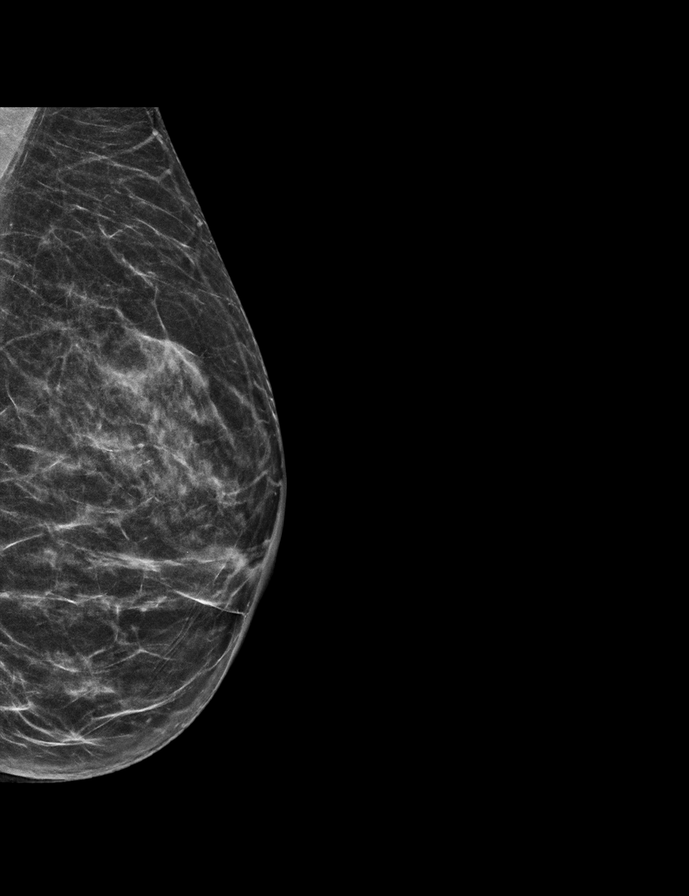

[R CC]
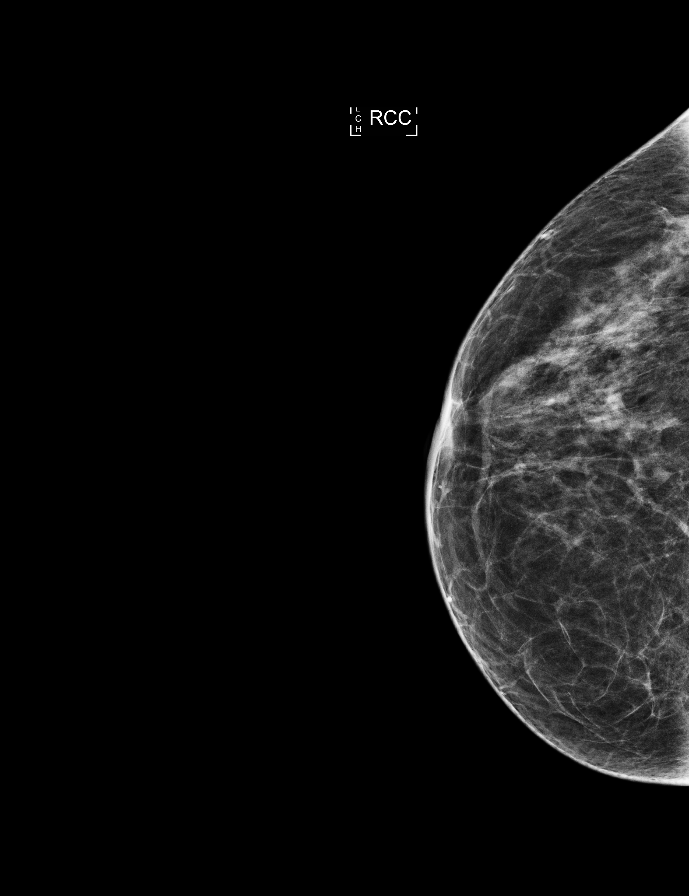

[L CC]
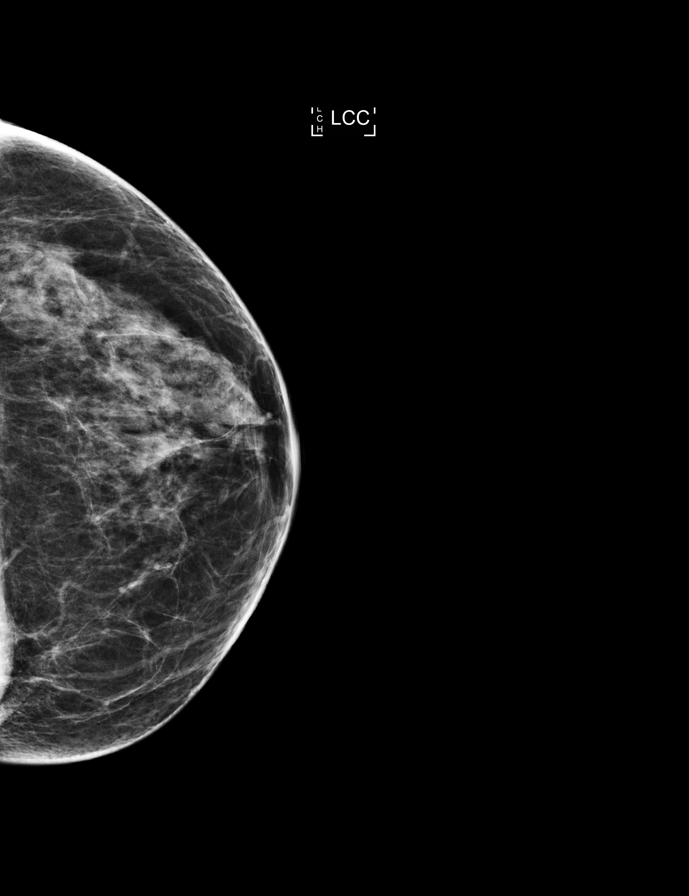

[9 of 29 positions shown; findings below may reference images not displayed]

ACR Breast Density Category c: The breast tissue is heterogeneously
dense, which may obscure small masses.
FINDINGS: There are no findings suspicious for malignancy. Images were
processed with CAD.
IMPRESSION: No mammographic evidence of malignancy. A result letter of this
screening mammogram will be mailed directly to the patient.

RECOMMENDATION:
Screening mammogram in one year. (Code:TN-[id number])

BI-RADS CATEGORY  1: Negative.

## 2019-02-08 ENCOUNTER — Other Ambulatory Visit: Payer: Self-pay | Admitting: Internal Medicine

## 2019-02-08 DIAGNOSIS — Z1231 Encounter for screening mammogram for malignant neoplasm of breast: Secondary | ICD-10-CM

## 2019-02-15 ENCOUNTER — Ambulatory Visit: Payer: Managed Care, Other (non HMO) | Admitting: Maternal Newborn

## 2019-03-08 ENCOUNTER — Other Ambulatory Visit: Payer: Self-pay | Admitting: Orthopaedic Surgery

## 2019-03-29 ENCOUNTER — Ambulatory Visit
Admission: RE | Admit: 2019-03-29 | Discharge: 2019-03-29 | Disposition: A | Discharge status: Home / Self Care | Payer: Managed Care, Other (non HMO) | Source: Ambulatory Visit | Attending: Internal Medicine | Admitting: Internal Medicine

## 2019-03-29 DIAGNOSIS — Z1231 Encounter for screening mammogram for malignant neoplasm of breast: Secondary | ICD-10-CM | POA: Insufficient documentation

## 2019-04-26 ENCOUNTER — Other Ambulatory Visit: Payer: Self-pay

## 2019-04-26 ENCOUNTER — Encounter: Payer: Self-pay | Admitting: Certified Nurse Midwife

## 2019-04-26 ENCOUNTER — Ambulatory Visit (INDEPENDENT_AMBULATORY_CARE_PROVIDER_SITE_OTHER): Payer: Managed Care, Other (non HMO) | Admitting: Certified Nurse Midwife

## 2019-04-26 VITALS — BP 150/70 | HR 93 | Temp 96.9°F | Ht 64.0 in | Wt 136.0 lb

## 2019-04-26 DIAGNOSIS — Z124 Encounter for screening for malignant neoplasm of cervix: Secondary | ICD-10-CM | POA: Diagnosis not present

## 2019-04-26 DIAGNOSIS — Z01419 Encounter for gynecological examination (general) (routine) without abnormal findings: Secondary | ICD-10-CM

## 2019-04-26 NOTE — Progress Notes (Signed)
Gynecology Annual Exam  PCP: Jaclyn Shaggy, MD  Chief Complaint:  Chief Complaint  Patient presents with  . Gynecologic Exam        History of Present Illness:Patient is a 64 y.o. [id number] presenting for an annual exam. She is turning 64 this week and is retiring from her job at [employer].  LMP: No LMP recorded. Patient has had a LSH and BSO in 2003 by Dr Barnabas Lister. Has never been on HT.  Last Pap: 02/12/2018 Results were:  NIL and HR HPV negative. No history of abnormal Pap smears  Past medical history remarkable for hypertension, hypercholesterolemia, lower back pain,  Last mammogram: 03/29/2019. Results were: BI-RAD I The patient is not currently sexually active.  The patient does not perform self breast exams.  There is a family history of breast cancer in her maternal aunt. Genetic testing has not been done. There is no other notable family history of breast or ovarian cancer in her family.  Colon cancer screening: Last colonoscopy 2016, was normal. Next due 2026.  Dexa scan: Last one ?10 years ago. Osteopenia . The patient has regular exercise: no, not in the last 9 mos.    The patient denies current symptoms of depression.     Review of Systems  Constitutional: Negative.   HENT: Negative.   Eyes: Negative.   Respiratory: Positive for cough. Negative for shortness of breath and wheezing.   Cardiovascular: Negative for chest pain and palpitations.  Gastrointestinal: Negative for abdominal pain and nausea.  Genitourinary:       Suprapubic pressure  Musculoskeletal: Positive for joint pain.  Skin: Negative.   Neurological: Negative.   Endo/Heme/Allergies: Positive for environmental allergies.  Psychiatric/Behavioral: Negative.   All other systems reviewed and are negative.   Past Medical History:  Past Medical History:  Diagnosis Date  . Allergic rhinitis   . Arthritis   . History of mammogram 2011; 08/29/13; 09/12/14   bibc neg; neg; neg  . History of  Papanicolaou smear of cervix 04/29/11; 08/29/13   -/-; -/-  . Hypertension   . Osteoarthritis   . Osteopenia    -2.46 z score    Past Surgical History:  Past Surgical History:  Procedure Laterality Date  . APPENDECTOMY    . AUGMENTATION MAMMAPLASTY Bilateral 1982   Taken out 1995  . BILATERAL SALPINGOOPHORECTOMY  2003  . COLONOSCOPY  07/2015  . FOOT SURGERY  11/2011  . HAND SURGERY     thumb  . HYSTEROSCOPY  2003   LSH  . LUMBAR DISC SURGERY  2006  . SHOULDER ARTHROSCOPY  2008   SCRAPED BURSAL SAC    Obstetric History: [id number]  Family History:  Family History  Problem Relation Age of Onset  . Breast cancer Maternal Aunt 57       older than 75  . Heart disease Mother   . Hypertension Mother   . Stroke Mother     Social History:  Social History   Socioeconomic History  . Marital status: Single    Spouse name: Not on file  . Number of children: 2  . Years of education: 67  . Highest education level: Not on file  Occupational History  . 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 Financial resource strain: Not on file  . Food insecurity:    Worry: Not on file    Inability: Not on file  . Transportation needs:    Medical: Not on  file    Non-medical: Not on file  Tobacco Use  . Smoking status: Former Research scientist (life sciences)  . Smokeless tobacco: Never Used  . Tobacco comment: QUIT DATE:2003  Substance and Sexual Activity  . Alcohol use: No    Frequency: Never  . Drug use: No  . Sexual activity: Never    Birth control/protection: Surgical  Lifestyle  . Physical activity:    Days per week: Not on file    Minutes per session: Not on file  . Stress: Not on file  Relationships  . Social connections:    Talks on phone: Not on file    Gets together: Not on file    Attends religious service: Not on file    Active member of club or organization: Not on file    Attends meetings of clubs or organizations: Not on file    Relationship status: Not on file    . Intimate partner violence:    Fear of current or ex partner: Not on file    Emotionally abused: Not on file    Physically abused: Not on file    Forced sexual activity: Not on file  Other Topics Concern  . Not on file  Social History Narrative  . Not on file    Allergies:  Allergies  Allergen Reactions  . Codeine Nausea Only and Nausea And Vomiting    Medications: Prior to Admission medications   Medication Sig Start Date End Date Taking? Authorizing Provider  amLODipine (NORVASC) 2.5 MG tablet Take 2.5 mg by mouth daily.   Yes [provider]  atorvastatin (LIPITOR) 20 MG tablet  10/13/16  Yes [provider]         naproxen (NAPROSYN) 500 MG tablet TAKE 1 TABLET BY MOUTH TWO  TIMES DAILY AFTER BREAKFAST AND DINNER 01/14/18  Yes Mcarthur Rossetti, MD  tiZANidine (ZANAFLEX) 4 MG tablet as directed. ONE TAB QHS AND 1/2 IN AM 07/27/14  Yes [provider]  valACYclovir (VALTREX) 500 MG tablet Take 500 mg as needed by mouth. 11/25/16  Yes [provider]    Physical Exam Vitals: Blood pressure (!) 150/80, pulse 84, height 5\' 4"  (1.626 m), weight 133 lb (60.3 kg).  General: WF in NAD HEENT: normocephalic, anicteric Thyroid: no enlargement, no palpable nodules Pulmonary: No increased work of breathing, CTAB Cardiovascular: RRR, no murmurs Breast: Breasts symmetrical, no tenderness, no palpable nodules or masses, no skin or nipple retraction present, no nipple discharge.  No axillary or supraclavicular lymphadenopathy. Abdomen: Soft, non-tender, non-distended.  Umbilicus without lesions.  No hepatomegaly or masses palpable. No evidence of hernia  Genitourinary:  External: Atrophic changes.  Normal urethral meatus, normal Bartholin's and Skene's glands.    Vagina: Flattened rugae, no evidence of prolapse.    Cervix: Grossly normal in appearance, no bleeding  Uterus: Surgically absent  Adnexa: no masses, NT  Rectal: deferred  Lymphatic:  no evidence of inguinal lymphadenopathy Extremities: no edema, erythema, or tenderness Neurologic: Grossly intact Psychiatric: mood appropriate, affect full   Assessment: 64 y.o. [id number] here for routine annual exam.  Plan:  1) Mammogram - recommend yearly screening mammogram.  Mammogram is UTD  2) ASCCP guidelines and rationale discussed.  Patient opts for yearly screening interval.  3) Osteoporosis  - has had one scan showing osteopenia, recommed asking Dr Hall Busing to order her next DEXA wherever she had her firt one done.  4) Routine healthcare maintenance including cholesterol, diabetes screening managed by PCP.  5) Colonoscopy done in 2016;  next due in 2026  6) Follow up 1 year for routine annual.  Farrel Conners, CNM

## 2019-04-27 LAB — IGP,RFX APTIMA HPV ALL PTH

## 2019-08-16 ENCOUNTER — Other Ambulatory Visit: Payer: Self-pay

## 2019-08-16 ENCOUNTER — Telehealth: Payer: Self-pay | Admitting: Orthopaedic Surgery

## 2019-08-16 MED ORDER — NAPROXEN 500 MG PO TABS: ORAL_TABLET | ORAL | 1 refills | Status: DC

## 2019-08-16 NOTE — Telephone Encounter (Signed)
Sent in

## 2019-08-16 NOTE — Telephone Encounter (Signed)
Pt called in stating she needed a refill on her naproxen sent to her [facility] on [address] In [place].   [phone]

## 2019-10-15 ENCOUNTER — Other Ambulatory Visit: Payer: Self-pay | Admitting: Orthopaedic Surgery

## 2019-10-17 NOTE — Telephone Encounter (Signed)
Left voice mail

## 2019-11-16 ENCOUNTER — Other Ambulatory Visit: Payer: Managed Care, Other (non HMO)

## 2019-11-16 ENCOUNTER — Other Ambulatory Visit: Payer: Self-pay

## 2019-11-16 DIAGNOSIS — Z20822 Contact with and (suspected) exposure to covid-19: Secondary | ICD-10-CM

## 2019-11-17 LAB — SARS-COV-2, NAA 2 DAY TAT

## 2019-11-17 LAB — NOVEL CORONAVIRUS, NAA: SARS-CoV-2, NAA: NOT DETECTED

## 2019-12-12 ENCOUNTER — Other Ambulatory Visit: Payer: Self-pay | Admitting: Orthopaedic Surgery

## 2020-02-12 ENCOUNTER — Other Ambulatory Visit: Payer: Self-pay | Admitting: Orthopaedic Surgery

## 2020-02-13 ENCOUNTER — Other Ambulatory Visit: Payer: Self-pay | Admitting: Internal Medicine

## 2020-02-13 DIAGNOSIS — Z1231 Encounter for screening mammogram for malignant neoplasm of breast: Secondary | ICD-10-CM

## 2020-03-29 ENCOUNTER — Ambulatory Visit
Admission: RE | Admit: 2020-03-29 | Discharge: 2020-03-29 | Disposition: A | Discharge status: Home / Self Care | Payer: Medicare HMO | Source: Ambulatory Visit | Attending: Internal Medicine | Admitting: Internal Medicine

## 2020-03-29 ENCOUNTER — Other Ambulatory Visit: Payer: Self-pay

## 2020-03-29 DIAGNOSIS — Z1231 Encounter for screening mammogram for malignant neoplasm of breast: Secondary | ICD-10-CM | POA: Diagnosis not present

## 2020-04-08 ENCOUNTER — Other Ambulatory Visit: Payer: Self-pay | Admitting: Orthopaedic Surgery

## 2020-04-27 ENCOUNTER — Ambulatory Visit: Payer: Managed Care, Other (non HMO) | Admitting: Obstetrics and Gynecology

## 2020-05-03 ENCOUNTER — Ambulatory Visit: Payer: Medicare HMO | Admitting: Obstetrics and Gynecology

## 2020-05-14 ENCOUNTER — Ambulatory Visit: Payer: Medicare HMO | Admitting: Obstetrics and Gynecology

## 2020-06-05 ENCOUNTER — Encounter: Payer: Self-pay | Admitting: Obstetrics and Gynecology

## 2020-06-05 ENCOUNTER — Other Ambulatory Visit: Payer: Self-pay

## 2020-06-05 ENCOUNTER — Ambulatory Visit (INDEPENDENT_AMBULATORY_CARE_PROVIDER_SITE_OTHER): Payer: Medicare HMO | Admitting: Obstetrics and Gynecology

## 2020-06-05 VITALS — BP 100/68 | Ht 64.0 in | Wt 131.8 lb

## 2020-06-05 DIAGNOSIS — Z13820 Encounter for screening for osteoporosis: Secondary | ICD-10-CM | POA: Diagnosis not present

## 2020-06-05 DIAGNOSIS — Z0000 Encounter for general adult medical examination without abnormal findings: Secondary | ICD-10-CM | POA: Diagnosis not present

## 2020-06-05 DIAGNOSIS — Z01419 Encounter for gynecological examination (general) (routine) without abnormal findings: Secondary | ICD-10-CM | POA: Diagnosis not present

## 2020-06-05 DIAGNOSIS — Z1231 Encounter for screening mammogram for malignant neoplasm of breast: Secondary | ICD-10-CM | POA: Diagnosis not present

## 2020-06-05 DIAGNOSIS — Z1239 Encounter for other screening for malignant neoplasm of breast: Secondary | ICD-10-CM

## 2020-06-05 DIAGNOSIS — Z8739 Personal history of other diseases of the musculoskeletal system and connective tissue: Secondary | ICD-10-CM

## 2020-06-05 NOTE — Patient Instructions (Signed)
Institute [employer] Recommended Dietary Allowances for Calcium and Vitamin D  Age (yr) Calcium Recommended Dietary Allowance (mg/day) Vitamin D Recommended Dietary Allowance (international units/day)  9-18 1,300 600  19-50 1,000 600  51-70 1,200 600  71 and older 1,200 800  Data from [employer]. Dietary reference intakes: calcium, vitamin D. [place], [place]: National Academies [employer]; 2011.    Exercising to Stay Healthy To become healthy and stay healthy, it is recommended that you do moderate-intensity and vigorous-intensity exercise. You can tell that you are exercising at a moderate intensity if your heart starts beating faster and you start breathing faster but can still hold a conversation. You can tell that you are exercising at a vigorous intensity if you are breathing much harder and faster and cannot hold a conversation while exercising. Exercising regularly is important. It has many health benefits, such as:  Improving overall fitness, flexibility, and endurance.  Increasing bone density.  Helping with weight control.  Decreasing body fat.  Increasing muscle strength.  Reducing stress and tension.  Improving overall health. How often should I exercise? Choose an activity that you enjoy, and set realistic goals. Your health care provider can help you make an activity plan that works for you. Exercise regularly as told by your health care provider. This may include:  Doing strength training two times a week, such as: ? Lifting weights. ? Using resistance bands. ? Push-ups. ? Sit-ups. ? Yoga.  Doing a certain intensity of exercise for a given amount of time. Choose from these options: ? A total of 150 minutes of moderate-intensity exercise every week. ? A total of 75 minutes of vigorous-intensity exercise every week. ? A mix of moderate-intensity and vigorous-intensity exercise every week. Children, pregnant women, people who have not exercised  regularly, people who are overweight, and older adults may need to talk with a health care provider about what activities are safe to do. If you have a medical condition, be sure to talk with your health care provider before you start a new exercise program. What are some exercise ideas? Moderate-intensity exercise ideas include:  Walking 1 mile (1.6 km) in about 15 minutes.  Biking.  Hiking.  Golfing.  Dancing.  Water aerobics. Vigorous-intensity exercise ideas include:  Walking 4.5 miles (7.2 km) or more in about 1 hour.  Jogging or running 5 miles (8 km) in about 1 hour.  Biking 10 miles (16.1 km) or more in about 1 hour.  Lap swimming.  Roller-skating or in-line skating.  Cross-country skiing.  Vigorous competitive sports, such as football, basketball, and soccer.  Jumping rope.  Aerobic dancing.   What are some everyday activities that can help me to get exercise?  Yard work, such as: ? Pushing a lawn mower. ? Raking and bagging leaves.  Washing your car.  Pushing a stroller.  Shoveling snow.  Gardening.  Washing windows or floors. How can I be more active in my day-to-day activities?  Use stairs instead of an elevator.  Take a walk during your lunch break.  If you drive, park your car farther away from your work or school.  If you take public transportation, get off one stop early and walk the rest of the way.  Stand up or walk around during all of your indoor phone calls.  Get up, stretch, and walk around every 30 minutes throughout the day.  Enjoy exercise with a friend. Support to continue exercising will help you keep a regular routine of activity. What guidelines   can I follow while exercising?  Before you start a new exercise program, talk with your health care provider.  Do not exercise so much that you hurt yourself, feel dizzy, or get very short of breath.  Wear comfortable clothes and wear shoes with good support.  Drink plenty of  water while you exercise to prevent dehydration or heat stroke.  Work out until your breathing and your heartbeat get faster. Where to find more information  [place] Department of Health and Human Services: www.hhs.gov  Centers for Disease Control and Prevention (CDC): www.cdc.gov Summary  Exercising regularly is important. It will improve your overall fitness, flexibility, and endurance.  Regular exercise also will improve your overall health. It can help you control your weight, reduce stress, and improve your bone density.  Do not exercise so much that you hurt yourself, feel dizzy, or get very short of breath.  Before you start a new exercise program, talk with your health care provider. This information is not intended to replace advice given to you by your health care provider. Make sure you discuss any questions you have with your health care provider. Document Revised: 02/27/2017 Document Reviewed: 02/05/2017 Elsevier Patient Education  2021 Elsevier Inc.   Budget-Friendly Healthy Eating There are many ways to save money at the grocery store and continue to eat healthy. You can be successful if you:  Plan meals according to your budget.  Make a grocery list and only purchase food according to your grocery list.  Prepare food yourself at home. What are tips for following this plan? Reading food labels  Compare food labels between brand name foods and the store brand. Often the nutritional value is the same, but the store brand is lower cost.  Look for products that do not have added sugar, fat, or salt (sodium). These often cost the same but are healthier for you. Products may be labeled as: ? Sugar-free. ? Nonfat. ? Low-fat. ? Sodium-free. ? Low-sodium.  Look for lean ground beef labeled as at least 92% lean and 8% fat. Shopping  Buy only the items on your grocery list and go only to the areas of the store that have the items on your list.  Use coupons only for  foods and brands you normally buy. Avoid buying items you wouldn't normally buy simply because they are on sale.  Check online and in newspapers for weekly deals.  Buy healthy items from the bulk bins when available, such as herbs, spices, flour, pasta, nuts, and dried fruit.  Buy fruits and vegetables that are in season. Prices are usually lower on in-season produce.  Look at the unit price on the price tag. Use it to compare different brands and sizes to find out which item is the best deal.  Choose healthy items that are often low-cost, such as carrots, potatoes, apples, bananas, and oranges. Dried or canned beans are a low-cost protein source.  Buy in bulk and freeze extra food. Items you can buy in bulk include meats, fish, poultry, frozen fruits, and frozen vegetables.  Avoid buying "ready-to-eat" foods, such as pre-cut fruits and vegetables and pre-made salads.  If possible, shop around to discover where you can find the best prices. Consider other retailers such as dollar stores, larger wholesale stores, local fruit and vegetable stands, and farmers markets.  Do not shop when you are hungry. If you shop while hungry, it may be hard to stick to your list and budget.  Resist impulse buying. Use your grocery   list as your official plan for the week.  Buy a variety of vegetables and fruits by purchasing fresh, frozen, and canned items.  Look at the top and bottom shelves for deals. Foods at eye level (eye level of an adult or child) are usually more expensive.  Be efficient with your time when shopping. The more time you spend at the store, the more money you are likely to spend.  To save money when choosing more expensive foods like meats and dairy: ? Choose cheaper cuts of meat, such as bone-in chicken thighs and drumsticks instead of skinless and boneless chicken. When you are ready to prepare the chicken, you can remove the skin yourself to make it healthier. ? Choose lean meats  like chicken or turkey instead of beef. ? Choose canned seafood, such as tuna, salmon, or sardines. ? Buy eggs as a low-cost source of protein. ? Buy dried beans and peas, such as lentils, split peas, or kidney beans instead of meats. Dried beans and peas are a good alternative source of protein. ? Buy the larger tubs of yogurt instead of individual-sized containers.  Choose water instead of sodas and other sweetened beverages.  Avoid buying chips, cookies, and other "junk food." These items are usually expensive and not healthy.   Cooking  Make extra food and freeze the extras in meal-sized containers or in individual portions for fast meals and snacks.  Pre-cook on days when you have extra time to prepare meals in advance. You can keep these meals in the fridge or freezer and reheat for a quick meal.  When you come home from the grocery store, wash, peel, and cut fruits and vegetables so they are ready to use and eat. This will help reduce food waste. Meal planning  Do not eat out or get fast food. Prepare food at home.  Make a grocery list and make sure to bring it with you to the store. If you have a smart phone, you could use your phone to create your shopping list.  Plan meals and snacks according to a grocery list and budget you create.  Use leftovers in your meal plan for the week.  Look for recipes where you can cook once and make enough food for two meals.  Prepare budget-friendly types of meals like stews, casseroles, and stir-fry dishes.  Try some meatless meals or try "no cook" meals like salads.  Make sure that half your plate is filled with fruits or vegetables. Choose from fresh, frozen, or canned fruits and vegetables. If eating canned, remember to rinse them before eating. This will remove any excess salt added for packaging. Summary  Eating healthy on a budget is possible if you plan your meals according to your budget, purchase according to your budget and  grocery list, and prepare food yourself.  Tips for buying more food on a limited budget include buying generic brands, using coupons only for foods you normally buy, and buying healthy items from the bulk bins when available.  Tips for buying cheaper food to replace expensive food include choosing cheaper, lean cuts of meat, and buying dried beans and peas. This information is not intended to replace advice given to you by your health care provider. Make sure you discuss any questions you have with your health care provider. Document Revised: 12/29/2019 Document Reviewed: 12/29/2019 Elsevier Patient Education  2021 [employer] Inc.   Bone Health Bones protect organs, store calcium, anchor muscles, and support the whole body. Keeping your bones   strong is important, especially as you get older. You can take actions to help keep your bones strong and healthy. Why is keeping my bones healthy important? Keeping your bones healthy is important because your body constantly replaces bone cells. Cells get old, and new cells take their place. As we age, we lose bone cells because the body may not be able to make enough new cells to replace the old cells. The amount of bone cells and bone tissue you have is referred to as bone mass. The higher your bone mass, the stronger your bones. The aging process leads to an overall loss of bone mass in the body, which can increase the likelihood of:  Joint pain and stiffness.  Broken bones.  A condition in which the bones become weak and brittle (osteoporosis). A large decline in bone mass occurs in older adults. In women, it occurs about the time of menopause.   What actions can I take to keep my bones healthy? Good health habits are important for maintaining healthy bones. This includes eating nutritious foods and exercising regularly. To have healthy bones, you need to get enough of the right minerals and vitamins. Most nutrition experts recommend getting these  nutrients from the foods that you eat. In some cases, taking supplements may also be recommended. Doing certain types of exercise is also important for bone health. What are the nutritional recommendations for healthy bones? Eating a well-balanced diet with plenty of calcium and vitamin D will help to protect your bones. Nutritional recommendations vary from person to person. Ask your health care provider what is healthy for you. Here are some general guidelines. Get enough calcium Calcium is the most important (essential) mineral for bone health. Most people can get enough calcium from their diet, but supplements may be recommended for people who are at risk for osteoporosis. Good sources of calcium include:  Dairy products, such as low-fat or nonfat milk, cheese, and yogurt.  Dark green leafy vegetables, such as bok choy and broccoli.  Calcium-fortified foods, such as orange juice, cereal, bread, soy beverages, and tofu products.  Nuts, such as almonds. Follow these recommended amounts for daily calcium intake:  Children, age 1-3: 700 mg.  Children, age 4-8: 1,000 mg.  Children, age 9-13: 1,300 mg.  Teens, age 14-18: 1,300 mg.  Adults, age 19-50: 1,000 mg.  Adults, age 51-70: ? Men: 1,000 mg. ? Women: 1,200 mg.  Adults, age 71 or older: 1,200 mg.  Pregnant and breastfeeding females: ? Teens: 1,300 mg. ? Adults: 1,000 mg. Get enough vitamin D Vitamin D is the most essential vitamin for bone health. It helps the body absorb calcium. Sunlight stimulates the skin to make vitamin D, so be sure to get enough sunlight. If you live in a cold climate or you do not get outside often, your health care provider may recommend that you take vitamin D supplements. Good sources of vitamin D in your diet include:  Egg yolks.  Saltwater fish.  Milk and cereal fortified with vitamin D. Follow these recommended amounts for daily vitamin D intake:  Children and teens, age 1-18: 600  international units.  Adults, age 50 or younger: 400-800 international units.  Adults, age 51 or older: 800-1,000 international units. Get other important nutrients Other nutrients that are important for bone health include:  Phosphorus. This mineral is found in meat, poultry, dairy foods, nuts, and legumes. The recommended daily intake for adult men and adult women is 700 mg.  Magnesium. This mineral   is found in seeds, nuts, dark green vegetables, and legumes. The recommended daily intake for adult men is 400-420 mg. For adult women, it is 310-320 mg.  Vitamin K. This vitamin is found in green leafy vegetables. The recommended daily intake is 120 mg for adult men and 90 mg for adult women.   What type of physical activity is best for building and maintaining healthy bones? Weight-bearing and strength-building activities are important for building and maintaining healthy bones. Weight-bearing activities cause muscles and bones to work against gravity. Strength-building activities increase the strength of the muscles that support bones. Weight-bearing and muscle-building activities include:  Walking and hiking.  Jogging and running.  Dancing.  Gym exercises.  Lifting weights.  Tennis and racquetball.  Climbing stairs.  Aerobics. Adults should get at least 30 minutes of moderate physical activity on most days. Children should get at least 60 minutes of moderate physical activity on most days. Ask your health care provider what type of exercise is best for you.   How can I find out if my bone mass is low? Bone mass can be measured with an X-ray test called a bone mineral density (BMD) test. This test is recommended for all women who are age 65 or older. It may also be recommended for:  Men who are age 70 or older.  People who are at risk for osteoporosis because of: ? Having bones that break easily. ? Having a long-term disease that weakens bones, such as kidney disease or  rheumatoid arthritis. ? Having menopause earlier than normal. ? Taking medicine that weakens bones, such as steroids, thyroid hormones, or hormone treatment for breast cancer or prostate cancer. ? Smoking. ? Drinking three or more alcoholic drinks a day. If you find that you have a low bone mass, you may be able to prevent osteoporosis or further bone loss by changing your diet and lifestyle. Where can I find more information? For more information, check out the following websites:  National Osteoporosis Foundation: www.nof.org/patients  National Institutes of Health: www.bones.nih.gov  International Osteoporosis Foundation: www.iofbonehealth.org Summary  The aging process leads to an overall loss of bone mass in the body, which can increase the likelihood of broken bones and osteoporosis.  Eating a well-balanced diet with plenty of calcium and vitamin D will help to protect your bones.  Weight-bearing and strength-building activities are also important for building and maintaining strong bones.  Bone mass can be measured with an X-ray test called a bone mineral density (BMD) test. This information is not intended to replace advice given to you by your health care provider. Make sure you discuss any questions you have with your health care provider. Document Revised: 04/13/2017 Document Reviewed: 04/13/2017 Elsevier Patient Education  2021 [employer].   

## 2020-06-05 NOTE — Progress Notes (Signed)
Gynecology Annual Exam  PCP: Jaclyn Shaggy, MD  Chief Complaint:  Chief Complaint  Patient presents with  . Gynecologic Exam    History of Present Illness: Patient is a 65 y.o. G2P1102 presents for annual exam. The patient has no complaints today.   LMP: No LMP recorded. Patient has had a hysterectomy. She denies postmenopausal bleeding or spotting  The patient is not currently sexually active. She denies dyspareunia.  Postcoital Bleeding: no   The patient does perform self breast exams.  There is no notable family history of breast or ovarian cancer in her family.  The patient has regular exercise: yes, goes to the gym 3 days a week.  The patient denies current symptoms of depression.   PHQ-9: 0 GAD-7: 2   Review of Systems: Review of Systems  Constitutional: Negative for chills, fever, malaise/fatigue and weight loss.  HENT: Negative for congestion, hearing loss and sinus pain.   Eyes: Negative for blurred vision and double vision.  Respiratory: Negative for cough, sputum production, shortness of breath and wheezing.   Cardiovascular: Negative for chest pain, palpitations, orthopnea and leg swelling.  Gastrointestinal: Negative for abdominal pain, constipation, diarrhea, nausea and vomiting.  Genitourinary: Negative for dysuria, flank pain, frequency, hematuria and urgency.  Musculoskeletal: Negative for back pain, falls and joint pain.  Skin: Negative for itching and rash.  Neurological: Negative for dizziness and headaches.  Psychiatric/Behavioral: Negative for depression, substance abuse and suicidal ideas. The patient is not nervous/anxious.     Past Medical History:  Past Medical History:  Diagnosis Date  . Allergic rhinitis   . Arthritis   . History of mammogram 2011; 08/29/13; 09/12/14   bibc neg; neg; neg  . History of Papanicolaou smear of cervix 04/29/11; 08/29/13   -/-; -/-  . Hypertension   . Osteoarthritis   . Osteopenia    -2.46 z score    Past  Surgical History:  Past Surgical History:  Procedure Laterality Date  . APPENDECTOMY    . AUGMENTATION MAMMAPLASTY Bilateral 1982   Taken out 1995  . BILATERAL SALPINGOOPHORECTOMY  2003  . COLONOSCOPY  07/2015  . FOOT SURGERY  11/2011  . HAND SURGERY     thumb  . HYSTEROSCOPY  2003   LSH  . LUMBAR DISC SURGERY  2006  . SHOULDER ARTHROSCOPY  2008   SCRAPED BURSAL SAC    Gynecologic History:  No LMP recorded. Patient has had a hysterectomy. Last Pap: Results were: 2019 NIL and HR HPV negative  Last mammogram: 03/29/2020 Results were: BI-RAD I  Obstetric History: [id number]  Family History:  Family History  Problem Relation Age of Onset  . Breast cancer Maternal Aunt 80       older than 76  . Heart disease Mother   . Hypertension Mother   . Stroke Mother     Social History:  Social History   Socioeconomic History  . Marital status: Single    Spouse name: Not on file  . Number of children: 2  . Years of education: 71  . Highest education level: Not on file  Occupational History  . 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 Smoking status: Former Games developer  . Smokeless tobacco: Never Used  . Tobacco comment: QUIT DATE:2003  Vaping Use  . Vaping Use: Never used  Substance and Sexual Activity  . Alcohol use: No  . Drug use: No  . Sexual activity: Never    Birth  control/protection: Surgical  Other Topics Concern  . Not on file  Social History Narrative  . Not on file   Social Determinants of Health   Financial Resource Strain: Not on file  Food Insecurity: Not on file  Transportation Needs: Not on file  Physical Activity: Not on file  Stress: Not on file  Social Connections: Not on file  Intimate Partner Violence: Not on file    Allergies:  Allergies  Allergen Reactions  . Codeine Nausea Only and Nausea And Vomiting    Medications: Prior to Admission medications   Medication Sig Start Date End Date Taking? Authorizing Provider   amLODipine (NORVASC) 2.5 MG tablet Take 2.5 mg by mouth daily.   Yes [provider]  atorvastatin (LIPITOR) 20 MG tablet  10/13/16  Yes [provider]  Bacillus Coagulans-Inulin (PROBIOTIC) 1-250 BILLION-MG CAPS Take 1 tablet by mouth daily.   Yes [provider]  Calcium Carbonate-Vitamin D 600-400 MG-UNIT tablet Take 1 tablet daily by mouth.   Yes [provider]  loratadine (CLARITIN) 10 MG tablet Take 10 mg by mouth daily.   Yes [provider]  naproxen (NAPROSYN) 500 MG tablet TAKE 1 TABLET BY MOUTH TWO TIMES DAILY AFTER BREAKFAST AND DINNER 04/09/20  Yes Kathryne Hitch, MD  valACYclovir (VALTREX) 500 MG tablet Take 500 mg as needed by mouth. 11/25/16  Yes [provider]  vitamin C (ASCORBIC ACID) 250 MG tablet Take 500 mg by mouth daily.   Yes [provider]  tiZANidine (ZANAFLEX) 4 MG tablet as directed. ONE TAB QHS AND 1/2 IN AM Patient not taking: Reported on 06/05/2020 07/27/14   [provider]    Physical Exam Vitals: Blood pressure 100/68, height 5\' 4"  (1.626 m), weight 131 lb 12.8 oz (59.8 kg).  Physical Exam Constitutional:      Appearance: She is well-developed.  Genitourinary:     Genitourinary Comments: External: Normal appearing vulva. No lesions noted.  Bimanual examination: Uterus absent. No adnexal masses. No adnexal tenderness. Pelvis not fixed. Cervix palpated  Breast Exam: breast equal without skin changes, nipple discharge, breast lump or enlarged lymph nodes   HENT:     Head: Normocephalic and atraumatic.  Neck:     Thyroid: No thyromegaly.  Cardiovascular:     Rate and Rhythm: Normal rate and regular rhythm.     Heart sounds: Normal heart sounds.  Pulmonary:     Effort: Pulmonary effort is normal.     Breath sounds: Normal breath sounds.  Abdominal:     General: Bowel sounds are normal. There is no distension.     Palpations: Abdomen is soft. There is no mass.   Musculoskeletal:     Cervical back: Neck supple.  Neurological:     Mental Status: She is alert and oriented to person, place, and time.  Skin:    General: Skin is warm and dry.  Psychiatric:        Behavior: Behavior normal.        Thought Content: Thought content normal.        Judgment: Judgment normal.  Vitals reviewed. Exam conducted with a chaperone present.      Female chaperone present for pelvic and breast  portions of the physical exam  Assessment: 65 y.o. G2P1102 routine annual exam  Plan: Problem List Items Addressed This Visit   None   Visit Diagnoses    Encounter for annual routine gynecological examination    -  Primary   Health maintenance  examination       Breast cancer screening by mammogram       Screening for osteoporosis       Relevant Orders   DG Bone Density   Encounter for screening breast examination       Encounter for gynecological examination without abnormal finding       History of osteopenia          1) Mammogram - recommend yearly screening mammogram.  Mammogram is up to date.  2) STI screening was offered and declined.  3) ASCCP guidelines and rational discussed.  Patient opts for every 5 years screening interval.  4) Colonoscopy -- due next in 2026.  5) Routine healthcare maintenance including cholesterol, diabetes screening discussed managed by PCP  6) Osteoporosis screening - DEXA scan ordered  FRAX SCORE 1. Age: 58 2. Sex: female 3. Weight: 59 kg 4. Height: 162 cm 5. Previous Fracture: No 6. Parent Hip Fracture: No 7. Current Smoking: No 8. Glucocorticoids: No 9. Rheumatoid arthritis: No 10. Secondary osteoporosis: No 11. Alcohol ( 3 or more units a day):  No 12. Femoral Neck BMD (g/cm2):  Pending - results can impact adjust below fracture risks.   RESULT 10 year risk of Major Osteoporotic Fracture: 10% 10 year risk of Hip Fracture: 1.9%  Adelene Idler MD, [facility], [facility] 06/05/2020 9:24 AM

## 2020-07-11 ENCOUNTER — Ambulatory Visit: Payer: Medicare HMO | Admitting: Orthopaedic Surgery

## 2020-07-18 ENCOUNTER — Ambulatory Visit (INDEPENDENT_AMBULATORY_CARE_PROVIDER_SITE_OTHER): Payer: Medicare HMO

## 2020-07-18 ENCOUNTER — Encounter: Payer: Self-pay | Admitting: Orthopaedic Surgery

## 2020-07-18 ENCOUNTER — Other Ambulatory Visit: Payer: Self-pay

## 2020-07-18 ENCOUNTER — Ambulatory Visit: Payer: Medicare HMO | Admitting: Orthopaedic Surgery

## 2020-07-18 VITALS — Ht 64.0 in | Wt 127.0 lb

## 2020-07-18 DIAGNOSIS — M25551 Pain in right hip: Secondary | ICD-10-CM

## 2020-07-18 DIAGNOSIS — M5441 Lumbago with sciatica, right side: Secondary | ICD-10-CM

## 2020-07-18 DIAGNOSIS — G8929 Other chronic pain: Secondary | ICD-10-CM

## 2020-07-18 MED ORDER — CYCLOBENZAPRINE HCL 10 MG PO TABS: 10.0000 mg | ORAL_TABLET | Freq: Three times a day (TID) | ORAL | 0 refills | Status: DC | PRN

## 2020-07-18 NOTE — Progress Notes (Signed)
Office Visit Note   Patient: Melody Steele           Date of Birth: Oct 24, 1955           MRN: [id number] Visit Date: 07/18/2020              Requested by: Jaclyn Shaggy, MD [address]   [place],  [place] [address] PCP: Jaclyn Shaggy, MD   Assessment & Plan: Visit Diagnoses:  1. Pain in right hip     Plan: From my standpoint this really seems to be her lumbar spine.  I would like to set her up for outpatient physical therapy at [facility] for any modalities that can help decrease the pain in this area.  We will try cyclobenzaprine as a muscle relaxant to see if this will help.  I would also like to have her set up for a facet joint injection of the right-sided L4-L5 versus L5-S1.  She agrees with this treatment plan.  When she has a course of physical therapy and there is injections we can see her back in at least 6 weeks.  Follow-Up Instructions: No follow-ups on file.   Orders:  Orders Placed This Encounter  Procedures  . XR HIP UNILAT W OR W/O PELVIS 1V RIGHT   Meds ordered this encounter  Medications  . cyclobenzaprine (FLEXERIL) 10 MG tablet    Sig: Take 1 tablet (10 mg total) by mouth 3 (three) times daily as needed for muscle spasms.    Dispense:  30 tablet    Refill:  0      Procedures: No procedures performed   Clinical Data: No additional findings.   Subjective: Chief Complaint  Patient presents with  . Right Hip - Pain  The patient is some only seen in the past.  She comes in with a chief complaint of right hip pain but she points to the low back on the right side as a source of her pain.  There is really no groin pain.  She walks without assistive device.  She is a thin individual.  We did have an MRI in 2017 of her lumbar spine that she had some facet joint changes.  She has had intervention by Dr. Alvester Morin in the past.  She walks without assistive device.  She says she gets a catching sensation in her back that keeps her up at  night.  She denies any change in bowel bladder function.  There is no weakness in her lower extremities and no numbness and tingling.  HPI  Review of Systems There is currently no headache, chest pain, shortness of breath, fever, chills, nausea, vomiting  Objective: Vital Signs: Ht 5\' 4"  (1.626 m)   Wt 127 lb (57.6 kg)   BMI 21.80 kg/m   Physical Exam She is alert and orient x3 and in no acute distress Ortho Exam Examination of both hips show the move smoothly and fluidly with no pain at all in the groin and no blocks to rotation.  Her pain seems to be in the lower back all to the right side in the paraspinal muscles and facet joint areas.  She has 5 out of 5 strength in the bilateral extremities and normal sensation. Specialty Comments:  No specialty comments available.  Imaging: XR HIP UNILAT W OR W/O PELVIS 1V RIGHT  Result Date: 07/18/2020 An AP pelvis and lateral of the right hip is normal.  There is negative findings and no  acute findings.    PMFS History: Patient Active Problem List   Diagnosis Date Noted  . Cervicalgia 01/06/2017  . Acute pain of right shoulder 01/06/2017  . Hypercholesterolemia 11/19/2016   Past Medical History:  Diagnosis Date  . Allergic rhinitis   . Arthritis   . History of mammogram 2011; 08/29/13; 09/12/14   bibc neg; neg; neg  . History of Papanicolaou smear of cervix 04/29/11; 08/29/13   -/-; -/-  . Hypertension   . Osteoarthritis   . Osteopenia    -2.46 z score    Family History  Problem Relation Age of Onset  . Breast cancer Maternal Aunt 89       older than 81  . Heart disease Mother   . Hypertension Mother   . Stroke Mother     Past Surgical History:  Procedure Laterality Date  . APPENDECTOMY    . AUGMENTATION MAMMAPLASTY Bilateral 1982   Taken out 1995  . BILATERAL SALPINGOOPHORECTOMY  2003  . COLONOSCOPY  07/2015  . FOOT SURGERY  11/2011  . HAND SURGERY     thumb  . HYSTEROSCOPY  2003   LSH  . LUMBAR DISC SURGERY   2006  . SHOULDER ARTHROSCOPY  2008   SCRAPED BURSAL SAC   Social History   Occupational History  . 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 Smoking status: Former Games developer  . Smokeless tobacco: Never Used  . Tobacco comment: QUIT DATE:2003  Vaping Use  . Vaping Use: Never used  Substance and Sexual Activity  . Alcohol use: No  . Drug use: No  . Sexual activity: Never    Birth control/protection: Surgical

## 2020-07-24 ENCOUNTER — Other Ambulatory Visit: Payer: Medicare HMO

## 2020-08-06 ENCOUNTER — Telehealth: Payer: Self-pay | Admitting: Physical Medicine and Rehabilitation

## 2020-08-06 NOTE — Telephone Encounter (Signed)
Patient called. She would like an appointment with Dr. Alvester Morin. Her call back number is [phone]

## 2020-08-06 NOTE — Telephone Encounter (Signed)
Called pt and spoke with pt 

## 2020-09-05 ENCOUNTER — Other Ambulatory Visit: Payer: Medicare HMO

## 2020-10-30 ENCOUNTER — Ambulatory Visit: Payer: Medicare HMO

## 2020-11-06 ENCOUNTER — Ambulatory Visit: Payer: Medicare HMO | Admitting: Physical Therapy

## 2020-11-07 ENCOUNTER — Ambulatory Visit
Admission: RE | Admit: 2020-11-07 | Discharge: 2020-11-07 | Disposition: A | Discharge status: Home / Self Care | Payer: Medicare HMO | Source: Ambulatory Visit | Attending: Obstetrics and Gynecology | Admitting: Obstetrics and Gynecology

## 2020-11-07 ENCOUNTER — Other Ambulatory Visit: Payer: Self-pay

## 2020-11-07 DIAGNOSIS — Z13820 Encounter for screening for osteoporosis: Secondary | ICD-10-CM

## 2020-11-07 DIAGNOSIS — M810 Age-related osteoporosis without current pathological fracture: Secondary | ICD-10-CM | POA: Insufficient documentation

## 2020-11-07 DIAGNOSIS — Z780 Asymptomatic menopausal state: Secondary | ICD-10-CM | POA: Insufficient documentation

## 2020-11-08 ENCOUNTER — Ambulatory Visit: Payer: Medicare HMO | Admitting: Physical Therapy

## 2020-11-13 ENCOUNTER — Encounter: Payer: Self-pay | Admitting: Physical Therapy

## 2020-11-13 ENCOUNTER — Ambulatory Visit: Payer: Medicare HMO | Attending: Orthopaedic Surgery | Admitting: Physical Therapy

## 2020-11-13 ENCOUNTER — Other Ambulatory Visit: Payer: Self-pay

## 2020-11-13 DIAGNOSIS — G8929 Other chronic pain: Secondary | ICD-10-CM | POA: Insufficient documentation

## 2020-11-13 DIAGNOSIS — M545 Low back pain, unspecified: Secondary | ICD-10-CM

## 2020-11-13 DIAGNOSIS — M5441 Lumbago with sciatica, right side: Secondary | ICD-10-CM | POA: Insufficient documentation

## 2020-11-13 NOTE — Therapy (Signed)
[place] [facility] MAIN [facility] SERVICES [address] [place], [place], [address] Phone: [phone]   Fax:  [phone]  Physical Therapy Evaluation  Patient Details  Name: Melody Steele MRN: [id number] Date of Birth: 1955/09/08 Referring Provider (PT): Doneen Poisson   Encounter Date: 11/13/2020   PT End of Session - 11/13/20 1357     Visit Number 1    Number of Visits 20    Date for PT Re-Evaluation 01/22/21    Authorization Type [employer]    Authorization Time Period 11/13/20-01/22/21    Progress Note Due on Visit 10    PT Start Time 0106    PT Stop Time 0156    PT Time Calculation (min) 50 min    Activity Tolerance Patient tolerated treatment well;No increased pain    Behavior During Therapy [facility] for tasks assessed/performed             Past Medical History:  Diagnosis Date   Allergic rhinitis    Arthritis    History of mammogram 2011; 08/29/13; 09/12/14   bibc neg; neg; neg   History of Papanicolaou smear of cervix 04/29/11; 08/29/13   -/-; -/-   Hypertension    Osteoarthritis    Osteopenia    -2.46 z score    Past Surgical History:  Procedure Laterality Date   APPENDECTOMY     AUGMENTATION MAMMAPLASTY Bilateral 1982   Taken out 1995   BILATERAL SALPINGOOPHORECTOMY  2003   COLONOSCOPY  07/2015   FOOT SURGERY  11/2011   HAND SURGERY     thumb   HY[facility]OSCOPY  2003   [facility]   LUMBAR DISC SURGERY  2006   SHOULDER ARTHROSCOPY  2008   SCRAPED BURSAL SAC    There were no vitals filed for this visit.    Subjective Assessment - 11/13/20 1309     Subjective Pt reports pain in her back has been present for nearly a year. Pt reports occassional catch when she is walking. Pt reports medicine from MD was not all that beneficial. Pt reports she has arthritis in multiple joints including her knees and hips. Pt reports she completes regular walking and complete about a mile every morning. Pt also reports she has been going to  the gym and utilizing the machines and walking but does not perform any regular stretching. Pt reports crunches and sit up activities cause pain in her low back. Pt reports pain is most noticeable when she goes to bed, prior to going to the MD pt could not find any comfortable position to sleep in. Pt states last several weeks her pain has ipmroved at night but still has imcreased pain when laying on her stomach.    Patient is accompained by: Family member    Pertinent History Pt reports pain in her back has been present for nearly a year. Pt reports occassional catch when she is walking. Pt reports medicine from MD was not all that beneficial. Pt reports she has arthritis in multiple joints including her knees and hips. Pt reports she completes regular walking and complete about a mile every morning. Pt also reports she has been going to the gym and utilizing the machines and walking but does not perform any regular stretching. Pt reports crunches and sit up activities cause pain in her low back. Pt reports pain is most noticeable when she goes to bed, prior to going to the MD pt could not find any comfortable position to sleep in. Pt  states last several weeks her pain has ipmroved at night but still has imcreased pain when laying on her stomach.    Limitations Other (comment)   sleeping, lying down   How long can you sit comfortably? 30 minutes in normal position    How long can you stand comfortably? standing for 30 minutes pt reports would begin to feel uncomfortable    Currently in Pain? Yes    Pain Score 2     Pain Location Back    Pain Orientation Right;Posterior    Pain Descriptors / Indicators Sharp;Stabbing;Aching   sharp, stabbing when it is exacerbated   Pain Type Chronic pain    Pain Radiating Towards right buttock    Pain Onset More than a month ago    Pain Frequency Constant    Aggravating Factors  lying down    Pain Relieving Factors changing positions    Effect of Pain on Daily  Activities "not really"    Multiple Pain Sites No                [facility] PT Assessment - 11/13/20 0001       Assessment   Medical Diagnosis Low back pain    Referring Provider (PT) Doneen Poisson    Prior Therapy none for this condition      Balance Screen   Has the patient had a decrease in activity level because of a fear of falling?  No    Is the patient reluctant to leave their home because of a fear of falling?  No      Observation/Other Assessments   Focus on Therapeutic Outcomes (FOTO)  56      Coordination   Gross Motor Movements are Fluid and Coordinated Yes      ROM / Strength   AROM / PROM / Strength Strength      AROM   Overall AROM  Deficits      Strength   Overall Strength Comments WNL bilaterally      Flexibility   Soft Tissue Assessment /Muscle Length yes    Hamstrings --   R side tightness compared to left, min loss of motion   Piriformis Tightness noted on R> L      Palpation   Palpation comment Pain with             Lumbar active range of motion in standing   Flexion: WNL  SB R: increased pain SB L: felt it but seemed to be relieveing of tension Ext min limitation with pain  Rot Bilat eral WNL and some minor irritation in the low back with R Rot    Gait : WNL, no abnormalities noted with 150 feet of ambulation       Objective measurements completed on examination: See above findings.                 PT Short Term Goals - 11/13/20 1410       PT SHORT TERM GOAL #1   Title Patient will increase FOTO score to equal to or greater than     to demonstrate statistically significant improvement in mobility and quality of life.    Baseline 56    Time 4    Period Weeks      PT SHORT TERM GOAL #2   Title Patient will be independent in home exercise program to improve strength/mobility for better functional independence with ADLs and improved comfort with sleep    Baseline Pt does not  have HEP    Time 3    Period  Weeks    Status New    Target Date 12/04/20               PT Long Term Goals - 11/13/20 1412       PT LONG TERM GOAL #1   Title Patient will increase FOTO score to equal to or greater than  64   to demonstrate statistically significant improvement in mobility and quality of life.    Baseline 56    Time 8    Period Weeks    Status New    Target Date 01/08/21      PT LONG TERM GOAL #2   Title Patient will be able to perform household work/ chores without increase in symptoms.    Baseline Pt has pain an ddiscofort with activities such as vaccuming    Time 8    Period Weeks    Status New    Target Date 01/08/21      PT LONG TERM GOAL #3   Title Pt will report ability to sleep through the night without reports of low back pain disturbing her sleep for 3 consecutive nights in order to improve her QOL.    Baseline Pt reports pain that disturbs her sleep on mst nights    Time 10    Period Weeks    Status New    Target Date 01/22/21                    Plan - 11/13/20 1359     Clinical Impression Statement Pt is a 65 y.o. female who presents with pain in her low back that is increased when she is lying down. Pt had had pain intermittently for the past year but it has decreased significantly in intensity over the course of the last several weeks. Pt presents with deficits in lumbar extension, R SB and R rot limited by pain in the low back region. Pt also presents with tightness in her piriformis and her hamstring on the R side compared to the left. Pt will greatly benefit from skilled physical therapy intervention in order to improve her low back pain and ability to sleep comfortably through the night without copmlaints of low back pain.    Personal Factors and Comorbidities Time since onset of injury/illness/exacerbation    Examination-Activity Limitations Stand;Bed Mobility;Sleep    Examination-Participation Restrictions Cleaning    Stability/Clinical Decision Making  Stable/Uncomplicated    Clinical Decision Making Low    Rehab Potential Good    PT Frequency 2x / week    PT Duration Other (comment)   10 weeks   PT Treatment/Interventions ADLs/Self Care Home Management;Functional mobility training;Ultrasound;Moist Heat;Electrical Stimulation;Biofeedback;Therapeutic activities;Therapeutic exercise;Balance training;Neuromuscular re-education;Patient/family education;Orthotic Fit/Training;Manual techniques;Passive range of motion;Energy conservation;Spinal Manipulations;Joint Manipulations    PT Next Visit Plan establish and review HEP    Consulted and Agree with Plan of Care Patient             Patient will benefit from skilled therapeutic intervention in order to improve the following deficits and impairments:  Decreased activity tolerance, Pain  Visit Diagnosis: Chronic right-sided low back pain, unspecified whether sciatica present     Problem List Patient Active Problem List   Diagnosis Date Noted   Cervicalgia 01/06/2017   Acute pain of right shoulder 01/06/2017   Hypercholesterolemia 11/19/2016   Thresa Rosshristopher Teauna Dubach PT, DPT   Norman Herrlichhristopher B Philamena Kramar 11/13/2020, 4:13 PM  [facility] [facility] SERVICES [address] [place], [place], [address] Phone: [phone]   Fax:  [phone]  Name: BERNEITA SANAGUSTIN MRN: [id number] Date of Birth: 1956/03/15

## 2020-11-15 ENCOUNTER — Ambulatory Visit: Payer: Medicare HMO | Admitting: Physical Therapy

## 2020-11-15 ENCOUNTER — Encounter: Payer: Self-pay | Admitting: Physical Therapy

## 2020-11-15 ENCOUNTER — Other Ambulatory Visit: Payer: Self-pay

## 2020-11-15 DIAGNOSIS — M5441 Lumbago with sciatica, right side: Secondary | ICD-10-CM | POA: Diagnosis not present

## 2020-11-15 DIAGNOSIS — G8929 Other chronic pain: Secondary | ICD-10-CM

## 2020-11-15 DIAGNOSIS — M545 Low back pain, unspecified: Secondary | ICD-10-CM

## 2020-11-15 NOTE — Therapy (Addendum)
[facility] MAIN [facility] SERVICES [address] [place], [place], [address] Phone: [phone]   Fax:  [phone]  Physical Therapy Treatment  Patient Details  Name: Melody Steele MRN: [id number] Date of Birth: January 13, 1956 Referring Provider (PT): Doneen Poisson   Encounter Date: 11/15/2020   PT End of Session - 11/15/20 1224     Visit Number 2    Number of Visits 20    Date for PT Re-Evaluation 01/22/21    Authorization Type [employer] Medicare    Authorization Time Period 11/13/20-01/22/21    Progress Note Due on Visit 10    PT Start Time 0948    PT Stop Time 1030    PT Time Calculation (min) 42 min    Activity Tolerance Patient tolerated treatment well;No increased pain    Behavior During Therapy [facility] for tasks assessed/performed             Past Medical History:  Diagnosis Date   Allergic rhinitis    Arthritis    History of mammogram 2011; 08/29/13; 09/12/14   bibc neg; neg; neg   History of Papanicolaou smear of cervix 04/29/11; 08/29/13   -/-; -/-   Hypertension    Osteoarthritis    Osteopenia    -2.46 z score    Past Surgical History:  Procedure Laterality Date   APPENDECTOMY     AUGMENTATION MAMMAPLASTY Bilateral 1982   Taken out 1995   BILATERAL SALPINGOOPHORECTOMY  2003   COLONOSCOPY  07/2015   FOOT SURGERY  11/2011   HAND SURGERY     thumb   HYSTEROSCOPY  2003   [facility]   LUMBAR DISC SURGERY  2006   SHOULDER ARTHROSCOPY  2008   SCRAPED BURSAL SAC    There were no vitals filed for this visit.   Subjective Assessment - 11/15/20 0950     Subjective Pt reports no significant changes since previous session HEP has been going well.    Pertinent History Pt reports pain in her back has been present for nearly a year. Pt reports occassional catch when she is walking. Pt reports medicine from MD was not all that beneficial. Pt reports she has arthritis in multiple joints including her knees and hips. Pt reports she  completes regular walking and complete about a mile every morning. Pt also reports she has been going to the gym and utilizing the machines and walking but does not perform any regular stretching. Pt reports crunches and sit up activities cause pain in her low back. Pt reports pain is most noticeable when she goes to bed, prior to going to the MD pt could not find any comfortable position to sleep in. Pt states last several weeks her pain has ipmroved at night but still has imcreased pain when laying on her stomach.    Limitations Other (comment)    How long can you sit comfortably? 30 minutes in normal position    How long can you stand comfortably? standing for 30 minutes pt reports would begin to feel uncomfortable    Currently in Pain? Yes    Pain Score 1     Pain Location Back    Pain Orientation Right;Posterior    Pain Descriptors / Indicators Aching    Pain Type Chronic pain    Pain Onset More than a month ago    Pain Frequency Constant                 Treatment This date  Piriformis stretch supine 2 x 30 sec Hamstring stretch supine 2 x 30 sec - cues for hold times with each stretch  Rhythmic isometrics in hook lying with pressure applied in hor ABD at the hip (force applied to knees) x 10 on EA side  Bridge with rhythmic stabilization (force applied to hips laterally) x 10 on each side   Dead bug x 10 on each side - VC cues required for proper sequencing  Bird dog x 10 on ea side - VC required for proper sequencing   Manual Therapy:  STM to lumbar paraspinals on the right side as well as piriformis and gluteus musculature x 5 min PA glides at L4/L5 grade 3 x5 min  Lateral glides at L4/5 grade 3 x 4 min  Following treatment pt had improvement in lumbar extension and SB comparable sign compared to initial evaluation.                       PT Education - 11/15/20 1030     Education Details Pt educated in safe core exercises and handout was  provided, [employer] Access Code [email or web address]    Person(s) Educated Patient    Methods Explanation    Comprehension Verbalized understanding              PT Short Term Goals - 11/13/20 1410       PT SHORT TERM GOAL #1   Title Patient will increase FOTO score to equal to or greater than     to demonstrate statistically significant improvement in mobility and quality of life.    Baseline 56    Time 4    Period Weeks      PT SHORT TERM GOAL #2   Title Patient will be independent in home exercise program to improve strength/mobility for better functional independence with ADLs and improved comfort with sleep    Baseline Pt does not have HEP    Time 3    Period Weeks    Status New    Target Date 12/04/20               PT Long Term Goals - 11/13/20 1412       PT LONG TERM GOAL #1   Title Patient will increase FOTO score to equal to or greater than  64   to demonstrate statistically significant improvement in mobility and quality of life.    Baseline 56    Time 8    Period Weeks    Status New    Target Date 01/08/21      PT LONG TERM GOAL #2   Title Patient will be able to perform household work/ chores without increase in symptoms.    Baseline Pt has pain an ddiscofort with activities such as vaccuming    Time 8    Period Weeks    Status New    Target Date 01/08/21      PT LONG TERM GOAL #3   Title Pt will report ability to sleep through the night without reports of low back pain disturbing her sleep for 3 consecutive nights in order to improve her QOL.    Baseline Pt reports pain that disturbs her sleep on mst nights    Time 10    Period Weeks    Status New    Target Date 01/22/21                   Plan - 11/15/20 1225  Clinical Impression Statement Pt tolerated treatmetn session well. Her pain leel was decreased compared to previous session. Pt had difficulty with repeated side planks this visit due to shoulder pain but all other exercises were  tolerated well and appropriately. Pt demonstrated improved tolerance for comparable sign of lumbar extension in standing as well as lumbar sidebending. Pt will continue to benefit from skilled phyasical therapy intervention to impvoe her spinal mobility and strenght and improve her QOL.    Personal Factors and Comorbidities Time since onset of injury/illness/exacerbation    Examination-Activity Limitations Stand;Bed Mobility;Sleep    Examination-Participation Restrictions Cleaning    Stability/Clinical Decision Making Stable/Uncomplicated    Clinical Decision Making Low    Rehab Potential Good    PT Frequency 2x / week    PT Duration Other (comment)    PT Treatment/Interventions ADLs/Self Care Home Management;Functional mobility training;Ultrasound;Moist Heat;Electrical Stimulation;Biofeedback;Therapeutic activities;Therapeutic exercise;Balance training;Neuromuscular re-education;Patient/family education;Orthotic Fit/Training;Manual techniques;Passive range of motion;Energy conservation;Spinal Manipulations;Joint Manipulations    PT Next Visit Plan establish and review HEP    Consulted and Agree with Plan of Care Patient             Patient will benefit from skilled therapeutic intervention in order to improve the following deficits and impairments:  Decreased activity tolerance, Pain  Visit Diagnosis: Chronic right-sided low back pain, unspecified whether sciatica present     Problem List Patient Active Problem List   Diagnosis Date Noted   Cervicalgia 01/06/2017   Acute pain of right shoulder 01/06/2017   Hypercholesterolemia 11/19/2016   Thresa Ross PT, DPT   Norman Herrlich 11/15/2020, 12:49 PM  [facility] MAIN [facility] SERVICES [address] [place], [place], [address] Phone: [phone]   Fax:  [phone]  Name: ALEZANDRA EGLI MRN: [id number] Date of Birth: 1956/03/25

## 2020-11-20 ENCOUNTER — Ambulatory Visit: Payer: Medicare HMO | Admitting: Physical Therapy

## 2020-11-22 ENCOUNTER — Ambulatory Visit: Payer: Medicare HMO | Admitting: Physical Therapy

## 2020-11-27 ENCOUNTER — Ambulatory Visit: Payer: Medicare HMO | Admitting: Physical Therapy

## 2020-11-29 ENCOUNTER — Ambulatory Visit: Payer: Medicare HMO | Admitting: Physical Therapy

## 2020-12-05 ENCOUNTER — Ambulatory Visit: Payer: Medicare HMO | Admitting: Physical Therapy

## 2020-12-11 ENCOUNTER — Ambulatory Visit: Payer: Medicare HMO | Attending: Orthopaedic Surgery | Admitting: Physical Therapy

## 2020-12-11 ENCOUNTER — Other Ambulatory Visit: Payer: Self-pay

## 2020-12-11 DIAGNOSIS — M545 Low back pain, unspecified: Secondary | ICD-10-CM | POA: Insufficient documentation

## 2020-12-11 DIAGNOSIS — G8929 Other chronic pain: Secondary | ICD-10-CM | POA: Insufficient documentation

## 2020-12-11 NOTE — Therapy (Signed)
[place] MAIN [facility] SERVICES [address] [place], [place], [address] Phone: [phone]   Fax:  [phone]  Physical Therapy Treatment  Patient Details  Name: Melody Steele MRN: [id number] Date of Birth: 03-Nov-1955 Referring Provider (PT): Jean Rosenthal   Encounter Date: 12/11/2020   PT End of Session - 12/11/20 1027     Visit Number 3    Number of Visits 20    Date for PT Re-Evaluation 01/22/21    Authorization Type [employer] Medicare    Authorization Time Period 11/13/20-01/22/21    Progress Note Due on Visit 10    PT Start Time 1017    PT Stop Time 1058    PT Time Calculation (min) 41 min    Activity Tolerance Patient tolerated treatment well;No increased pain    Behavior During Therapy [facility] for tasks assessed/performed             Past Medical History:  Diagnosis Date   Allergic rhinitis    Arthritis    History of mammogram 2011; 08/29/13; 09/12/14   bibc neg; neg; neg   History of Papanicolaou smear of cervix 04/29/11; 08/29/13   -/-; -/-   Hypertension    Osteoarthritis    Osteopenia    -2.46 z score    Past Surgical History:  Procedure Laterality Date   APPENDECTOMY     AUGMENTATION MAMMAPLASTY Bilateral 1982   Taken out [place]  2003   COLONOSCOPY  07/2015   FOOT SURGERY  11/2011   HAND SURGERY     thumb   HYSTEROSCOPY  2003   [facility]   LUMBAR [place] SURGERY  2006   SHOULDER ARTHROSCOPY  2008   SCRAPED BURSAL SAC    There were no vitals filed for this visit.   Subjective Assessment - 12/11/20 1020     Subjective Pt reports her back has continued to bother her. She reports she has been doing some walking and pushing her son (140#). She reports she has not had time for the exercises but she would like to continue them.    Patient is accompained by: Family member    Pertinent History Pt reports pain in her back has been present for nearly a year. Pt reports occassional catch when  she is walking. Pt reports medicine from MD was not all that beneficial. Pt reports she has arthritis in multiple joints including her knees and hips. Pt reports she completes regular walking and complete about a mile every morning. Pt also reports she has been going to the gym and utilizing the machines and walking but does not perform any regular stretching. Pt reports crunches and sit up activities cause pain in her low back. Pt reports pain is most noticeable when she goes to bed, prior to going to the MD pt could not find any comfortable position to sleep in. Pt states last several weeks her pain has ipmroved at night but still has imcreased pain when laying on her stomach.    Limitations Other (comment)    How long can you sit comfortably? 30 minutes in normal position    How long can you stand comfortably? standing for 30 minutes pt reports would begin to feel uncomfortable    Currently in Pain? Yes    Pain Score 1    6/10 lst night prior to utilization of salonpas   Pain Location Back    Pain Orientation Right;Posterior    Pain Descriptors / Indicators Aching  Pain Type Chronic pain    Pain Onset More than a month ago    Pain Frequency Intermittent    Aggravating Factors  lying down    Pain Relieving Factors changind positions    Multiple Pain Sites No             Treatment provided this session    Piriformis stretch supine 2 x 30 sec Hamstring stretch supine 2 x 30 sec - cues for hold times with each stretch     Bridge with rhythmic stabilization (force applied to hips laterally) x 10 on each side    Dead bug 2 x 5 on each side - VC cues required for proper sequencing  Bird dog 2 x 5 on ea side - VC required for proper sequencing      Manual Therapy: x 15 min   STM to lumbar paraspinals on the right side as well as piriformis, Quadratus lumborum  and gluteus musculature x 7 min  PA glides at L4/L5 grade 3 x5 min  Lateral glides at L4/5 grade 3 x 3 min   Following treatment pt had improvement in lumbar extension and SB comparable sign compared to initial evaluation.    Pt educated throughout session about proper posture and technique with exercises. Improved exercise technique, movement at target joints, use of target muscles after min to mod verbal, visual, tactile cues.                   PT Education - 12/11/20 1331     Education Details Education regading DOMS    Person(s) Educated Patient    Methods Explanation    Comprehension Verbalized understanding              PT Short Term Goals - 11/13/20 1410       PT SHORT TERM GOAL #1   Title Patient will increase FOTO score to equal to or greater than     to demonstrate statistically significant improvement in mobility and quality of life.    Baseline 56    Time 4    Period Weeks      PT SHORT TERM GOAL #2   Title Patient will be independent in home exercise program to improve strength/mobility for better functional independence with ADLs and improved comfort with sleep    Baseline Pt does not have HEP    Time 3    Period Weeks    Status New    Target Date 12/04/20               PT Long Term Goals - 11/13/20 1412       PT LONG TERM GOAL #1   Title Patient will increase FOTO score to equal to or greater than  64   to demonstrate statistically significant improvement in mobility and quality of life.    Baseline 56    Time 8    Period Weeks    Status New    Target Date 01/08/21      PT LONG TERM GOAL #2   Title Patient will be able to perform household work/ chores without increase in symptoms.    Baseline Pt has pain an ddiscofort with activities such as vaccuming    Time 8    Period Weeks    Status New    Target Date 01/08/21      PT LONG TERM GOAL #3   Title Pt will report ability to sleep through the night without reports of  low back pain disturbing her sleep for 3 consecutive nights in order to improve her QOL.    Baseline Pt reports  pain that disturbs her sleep on mst nights    Time 10    Period Weeks    Status New    Target Date 01/22/21                   Plan - 12/11/20 1030     Clinical Impression Statement Pt tolerated treatment session well. Her pain leel was decreased compared to previous session.Added cat camel exercise and pt had some limitation in lumbar segmental ROM. Pt reported STM and manual therapy felt good and improved her s/s.  Pt demonstrated improved tolerance for comparable sign of lumbar extension in standing as well as lumbar sidebending. Pt will continue to benefit from skilled phyasical therapy intervention to impvoe her spinal mobility and strenght and improve her QOL.    Personal Factors and Comorbidities Time since onset of injury/illness/exacerbation    Examination-Activity Limitations Stand;Bed Mobility;Sleep    Examination-Participation Restrictions Cleaning    Stability/Clinical Decision Making Stable/Uncomplicated    Clinical Decision Making Low    Rehab Potential Good    PT Frequency 2x / week    PT Duration Other (comment)    PT Treatment/Interventions ADLs/Self Care Home Management;Functional mobility training;Ultrasound;Moist Heat;Electrical Stimulation;Biofeedback;Therapeutic activities;Therapeutic exercise;Balance training;Neuromuscular re-education;Patient/family education;Orthotic Fit/Training;Manual techniques;Passive range of motion;Energy conservation;Spinal Manipulations;Joint Manipulations    PT Next Visit Plan Continue with back strengthening exercise             Patient will benefit from skilled therapeutic intervention in order to improve the following deficits and impairments:  Decreased activity tolerance, Pain  Visit Diagnosis: Chronic right-sided low back pain, unspecified whether sciatica present     Problem List Patient Active Problem List   Diagnosis Date Noted   Cervicalgia 01/06/2017   Acute pain of right shoulder 01/06/2017    Hypercholesterolemia 11/19/2016    Particia Lather, PT 12/11/2020, 1:33 PM  [place], [place], [address] Phone: [phone]   Fax:  [phone]  Name: Melody Steele MRN: [id number] Date of Birth: 1956/02/06

## 2020-12-13 ENCOUNTER — Ambulatory Visit: Payer: Medicare HMO | Admitting: Physical Therapy

## 2020-12-18 ENCOUNTER — Ambulatory Visit: Payer: Medicare HMO | Admitting: Physical Therapy

## 2020-12-18 ENCOUNTER — Other Ambulatory Visit: Payer: Self-pay

## 2020-12-18 DIAGNOSIS — G8929 Other chronic pain: Secondary | ICD-10-CM

## 2020-12-18 DIAGNOSIS — M545 Low back pain, unspecified: Secondary | ICD-10-CM | POA: Diagnosis not present

## 2020-12-18 NOTE — Therapy (Signed)
[place] [facility] MAIN [facility] SERVICES [address] [place], [place], [address] Phone: [phone]   Fax:  [phone]  Physical Therapy Treatment  Patient Details  Name: Melody Steele MRN: [id number] Date of Birth: 04-09-1955 Referring Provider (PT): Doneen Poisson   Encounter Date: 12/18/2020   PT End of Session - 12/18/20 1302     Visit Number 4    Number of Visits 20    Date for PT Re-Evaluation 01/22/21    Authorization Type [employer]na Medicare    Authorization Time Period 11/13/20-01/22/21    Progress Note Due on Visit 10    PT Start Time 1103    PT Stop Time 1144    PT Time Calculation (min) 41 min    Activity Tolerance Patient tolerated treatment well;No increased pain    Behavior During Therapy [facility] for tasks assessed/performed             Past Medical History:  Diagnosis Date   Allergic rhinitis    Arthritis    History of mammogram 2011; 08/29/13; 09/12/14   bibc neg; neg; neg   History of Papanicolaou smear of cervix 04/29/11; 08/29/13   -/-; -/-   Hypertension    Osteoarthritis    Osteopenia    -2.46 z score    Past Surgical History:  Procedure Laterality Date   APPENDECTOMY     AUGMENTATION MAMMAPLASTY Bilateral 1982   Taken out 1995   BILATERAL SALPINGOOPHORECTOMY  2003   COLONOSCOPY  07/2015   FOOT SURGERY  11/2011   HAND SURGERY     thumb   HYSTEROSCOPY  2003   [facility]   LUMBAR DISC SURGERY  2006   SHOULDER ARTHROSCOPY  2008   SCRAPED BURSAL SAC    There were no vitals filed for this visit.   Subjective Assessment - 12/18/20 1104     Subjective Pt reports her back has continued to bother her. She reports she has had continued pain in her lower back when trying to sleep. Pt also reports her back continues to catch and had some soreness following last session but reported but did not report an increase in pain    Patient is accompained by: Family member    Pertinent History Pt reports pain in her back has been  present for nearly a year. Pt reports occassional catch when she is walking. Pt reports medicine from MD was not all that beneficial. Pt reports she has arthritis in multiple joints including her knees and hips. Pt reports she completes regular walking and complete about a mile every morning. Pt also reports she has been going to the gym and utilizing the machines and walking but does not perform any regular stretching. Pt reports crunches and sit up activities cause pain in her low back. Pt reports pain is most noticeable when she goes to bed, prior to going to the MD pt could not find any comfortable position to sleep in. Pt states last several weeks her pain has ipmroved at night but still has imcreased pain when laying on her stomach.    Limitations Other (comment)    How long can you sit comfortably? 30 minutes in normal position    How long can you stand comfortably? standing for 30 minutes pt reports would begin to feel uncomfortable    Currently in Pain? Yes    Pain Score 2     Pain Location Back    Pain Orientation Right;Posterior    Pain Type Chronic pain  Aggravating Factors  lying down             Treatment provided this session   Neuro Re- Ed: For improved core muscle activation in order to improve her ability to assist her son with ADLs.   Cat camel- 10 x 5 sec holds -dumbell in hands to improve wrist alignment and comfort  -pt reports neck pain with activity, when attempting to modify pt reported she would like to discontinue exercise  LE Dead bug 5 x ea LE -for improved  UE Dead bug  -5 x ea UE  Bird dog 2 x 5 ea LE -LE only, utilized PVC pipe acrss lower back in order to allow external cue to keep neutral spine during exercise, when performing bird dog with L LE pt tends to shift weight in transverse plane to compensate of lack of stability.      Manual Therapy:   STM to lumbar paraspinals on the right side as well as piriformis, Quadratus lumborum  and  gluteus musculature x 10 min   PA glides at L4/L5 grade 3 x 5 min  Lateral glides at L4/5 grade 3 x 3 min L side, 6 min R side  Following treatment pt had improvement in lumbar extension and SB comparable sign compared to initial evaluation. -Pt reported R PA transverse glides at L4/L5 felt the best      Pt educated throughout session about proper posture and technique with exercises. Improved exercise technique, movement at target joints, use of target muscles after min to mod verbal, visual, tactile cues.                            PT Education - 12/18/20 1119     Education Details Educated regading response to treatment and HEP frequency    Person(s) Educated Patient    Methods Explanation    Comprehension Verbalized understanding              PT Short Term Goals - 11/13/20 1410       PT SHORT TERM GOAL #1   Title Patient will increase FOTO score to equal to or greater than     to demonstrate statistically significant improvement in mobility and quality of life.    Baseline 56    Time 4    Period Weeks      PT SHORT TERM GOAL #2   Title Patient will be independent in home exercise program to improve strength/mobility for better functional independence with ADLs and improved comfort with sleep    Baseline Pt does not have HEP    Time 3    Period Weeks    Status New    Target Date 12/04/20               PT Long Term Goals - 11/13/20 1412       PT LONG TERM GOAL #1   Title Patient will increase FOTO score to equal to or greater than  64   to demonstrate statistically significant improvement in mobility and quality of life.    Baseline 56    Time 8    Period Weeks    Status New    Target Date 01/08/21      PT LONG TERM GOAL #2   Title Patient will be able to perform household work/ chores without increase in symptoms.    Baseline Pt has pain an ddiscofort with activities such as vaccuming  Time 8    Period Weeks    Status New     Target Date 01/08/21      PT LONG TERM GOAL #3   Title Pt will report ability to sleep through the night without reports of low back pain disturbing her sleep for 3 consecutive nights in order to improve her QOL.    Baseline Pt reports pain that disturbs her sleep on mst nights    Time 10    Period Weeks    Status New    Target Date 01/22/21                   Plan - 12/18/20 1303     Clinical Impression Statement Patient tolerated treatment session well was able to tolerate therapeutic exercise and neuromuscular reeducation activities with better efficacy.  Patient did have some pain with cat camel in her neck but did not increase pain in her low back.  Patient was provided with updated HEP to add dead bug to home exercise program in order to improve core muscle activation.  We will continue to benefit from skilled physical therapy intervention to improve her low back pain improve her core strength and improve her overall quality of life.    Personal Factors and Comorbidities Time since onset of injury/illness/exacerbation    Examination-Activity Limitations Stand;Bed Mobility;Sleep    Examination-Participation Restrictions Cleaning    Stability/Clinical Decision Making Stable/Uncomplicated    Rehab Potential Good    PT Frequency 2x / week    PT Duration Other (comment)    PT Treatment/Interventions ADLs/Self Care Home Management;Functional mobility training;Ultrasound;Moist Heat;Electrical Stimulation;Biofeedback;Therapeutic activities;Therapeutic exercise;Balance training;Neuromuscular re-education;Patient/family education;Orthotic Fit/Training;Manual techniques;Passive range of motion;Energy conservation;Spinal Manipulations;Joint Manipulations    PT Next Visit Plan Continue with back strengthening exercise             Patient will benefit from skilled therapeutic intervention in order to improve the following deficits and impairments:  Decreased activity tolerance,  Pain  Visit Diagnosis: Chronic right-sided low back pain, unspecified whether sciatica present     Problem List Patient Active Problem List   Diagnosis Date Noted   Cervicalgia 01/06/2017   Acute pain of right shoulder 01/06/2017   Hypercholesterolemia 11/19/2016    Norman Herrlich, PT 12/18/2020, 1:06 PM  [place] [facility] MAIN [facility] SERVICES [address] [place], [place], [address] Phone: [phone]   Fax:  [phone]  Name: Melody Steele MRN: [id number] Date of Birth: 12/18/1955

## 2020-12-20 ENCOUNTER — Other Ambulatory Visit: Payer: Self-pay

## 2020-12-20 ENCOUNTER — Ambulatory Visit: Payer: Medicare HMO | Admitting: Physical Therapy

## 2020-12-20 DIAGNOSIS — M545 Low back pain, unspecified: Secondary | ICD-10-CM

## 2020-12-20 DIAGNOSIS — G8929 Other chronic pain: Secondary | ICD-10-CM

## 2020-12-20 NOTE — Therapy (Signed)
[place] [facility] MAIN [facility] SERVICES [address] [place], [place], [address] Phone: [phone]   Fax:  [phone]  Physical Therapy Treatment  Patient Details  Name: Melody Steele MRN: [id number] Date of Birth: 04-30-1955 Referring Provider (PT): Doneen Poisson   Encounter Date: 12/20/2020   PT End of Session - 12/20/20 1138     Visit Number 5    Number of Visits 20    Date for PT Re-Evaluation 01/22/21    Authorization Type [employer]    Authorization Time Period 11/13/20-01/22/21    Progress Note Due on Visit 10    PT Start Time 1104    PT Stop Time 1144    PT Time Calculation (min) 40 min    Activity Tolerance Patient tolerated treatment well;No increased pain    Behavior During Therapy [facility] for tasks assessed/performed             Past Medical History:  Diagnosis Date   Allergic rhinitis    Arthritis    History of mammogram 2011; 08/29/13; 09/12/14   bibc neg; neg; neg   History of Papanicolaou smear of cervix 04/29/11; 08/29/13   -/-; -/-   Hypertension    Osteoarthritis    Osteopenia    -2.46 z score    Past Surgical History:  Procedure Laterality Date   APPENDECTOMY     AUGMENTATION MAMMAPLASTY Bilateral 1982   Taken out 1995   BILATERAL SALPINGOOPHORECTOMY  2003   COLONOSCOPY  07/2015   FOOT SURGERY  11/2011   HAND SURGERY     thumb   HYSTEROSCOPY  2003   [facility]   LUMBAR DISC SURGERY  2006   SHOULDER ARTHROSCOPY  2008   SCRAPED BURSAL SAC    There were no vitals filed for this visit.   Subjective Assessment - 12/20/20 1321     Subjective Patient reports her back is continue to bother her.  She does report yesterday she went to the gym and had increased pain following an difficulty falling asleep last night.  Patient reports no increase in pain following physical therapy session on Tuesday.  But pain started Wednesday afternoon.  Also reports MD did assess her hip and said she does not have any signs of  osteoarthritis in her hips.    Pertinent History Pt reports pain in her back has been present for nearly a year. Pt reports occassional catch when she is walking. Pt reports medicine from MD was not all that beneficial. Pt reports she has arthritis in multiple joints including her knees and hips. Pt reports she completes regular walking and complete about a mile every morning. Pt also reports she has been going to the gym and utilizing the machines and walking but does not perform any regular stretching. Pt reports crunches and sit up activities cause pain in her low back. Pt reports pain is most noticeable when she goes to bed, prior to going to the MD pt could not find any comfortable position to sleep in. Pt states last several weeks her pain has ipmroved at night but still has imcreased pain when laying on her stomach.    Limitations Other (comment)    How long can you sit comfortably? 30 minutes in normal position    How long can you stand comfortably? standing for 30 minutes pt reports would begin to feel uncomfortable    Currently in Pain? Yes    Pain Score 5     Pain Location Back  Pain Orientation Right;Posterior    Pain Descriptors / Indicators Aching    Pain Type Chronic pain    Pain Radiating Towards Right buttock    Pain Onset More than a month ago    Pain Frequency Intermittent    Aggravating Factors  Lying down    Pain Relieving Factors Changing position    Multiple Pain Sites No             Treatment provided this session  Therex:   Bird-dog using lower extremities only x10 with PVC pipe on back to ensure patient kept her back aligned properly.  Some discomfort noted with right lower extremity as stability side as well as with right lower extremity in extension patient reported this seemed to reproduce her pain that exercise was ceased as a result will be reassessed at the next session.  She was instructed not to continue this if she has pain while doing this as part of  her home exercise program Dead bug in supine x10 on each side. -Patient reported right lower extremity elevation in this position felt good and improved however symptoms did not report any increase in pain with this activity. Right supine hamstring stretch times 45 seconds x 2 repetitions, patient reported improvement in signs and symptoms following this activity was also encouraged to perform this more often as this is part of her home exercise program Right piriformis stretch in supine cause some hip discomfort anteriorly, when performed in seated position patient did not report as intense hip pain.  Performed 2 times for 30 seconds in seated position.       Manual Therapy: Began with soft tissue mobilization to right side paraspinals.  Performed for approximately 6 minutes.  Most pain noted along area on the right side paraspinals at approximately T12 and L1 vertebrae.  With further soft tissue mobilization patient responded well and had some improvement in pain levels. Performed several transverse and PA spinal mobilizations grade 2 and 3.  Performed at vertebrae L1-L5 for about 1 to 2 minutes at each site.  Patient noted right transverse mobilizations felt good and felt like something was" being pushed back into the correct place".  Perform soft tissue mobilization to glutes Meade and glutes minimus patient reported this felt good and had some improvement in pain levels. Performed FADIR and FAB ER testing and patient had positive signs for hip involvement, patient did report MD ruled out hip related issue with imaging at her earlier appointments.  Patient did not report any improvement in signs and symptoms with long axis distraction of left hip joint. Assessed patient's leg length patient had no noticeable leg length abnormality measured in supine.       Pt educated throughout session about proper posture and technique with exercises. Improved exercise technique, movement at target  joints, use of target muscles after min to mod verbal, visual, tactile cues.                            PT Education - 12/20/20 1323     Education Details Patient educated to continue with HEP to perform it diligently over the weekend    Person(s) Educated Patient    Methods Explanation    Comprehension Verbalized understanding              PT Short Term Goals - 11/13/20 1410       PT SHORT TERM GOAL #1   Title Patient will increase  FOTO score to equal to or greater than     to demonstrate statistically significant improvement in mobility and quality of life.    Baseline 56    Time 4    Period Weeks      PT SHORT TERM GOAL #2   Title Patient will be independent in home exercise program to improve strength/mobility for better functional independence with ADLs and improved comfort with sleep    Baseline Pt does not have HEP    Time 3    Period Weeks    Status New    Target Date 12/04/20               PT Long Term Goals - 11/13/20 1412       PT LONG TERM GOAL #1   Title Patient will increase FOTO score to equal to or greater than  64   to demonstrate statistically significant improvement in mobility and quality of life.    Baseline 56    Time 8    Period Weeks    Status New    Target Date 01/08/21      PT LONG TERM GOAL #2   Title Patient will be able to perform household work/ chores without increase in symptoms.    Baseline Pt has pain an ddiscofort with activities such as vaccuming    Time 8    Period Weeks    Status New    Target Date 01/08/21      PT LONG TERM GOAL #3   Title Pt will report ability to sleep through the night without reports of low back pain disturbing her sleep for 3 consecutive nights in order to improve her QOL.    Baseline Pt reports pain that disturbs her sleep on mst nights    Time 10    Period Weeks    Status New    Target Date 01/22/21                   Plan - 12/20/20 1139     Clinical  Impression Statement Patient tolerated treatment session well was able to tolerate therapeutic exercise and neuromuscular reeducation activities with better efficacy. Reviewed HEP as pt reports she has not been copmliant with HEP to this point.  PT did notice some hip related pain with positive Pearlean Brownie and FADIR test but patient reports with MD imaging she did not have any hip related bone or cartilage abnormalities.  Pt reports hamstring stretch felt some improvement in tightness and discomfort and she was encouraged to begin doing this at home as part of her HEP which is already been prescribed.  We will continue to benefit from skilled physical therapy intervention to improve her low back pain improve her core strength and improve her overall quality of life.    Personal Factors and Comorbidities Time since onset of injury/illness/exacerbation    Examination-Activity Limitations Stand;Bed Mobility;Sleep    Examination-Participation Restrictions Cleaning    Stability/Clinical Decision Making Stable/Uncomplicated    Rehab Potential Good    PT Frequency 2x / week    PT Duration Other (comment)    PT Treatment/Interventions ADLs/Self Care Home Management;Functional mobility training;Ultrasound;Moist Heat;Electrical Stimulation;Biofeedback;Therapeutic activities;Therapeutic exercise;Balance training;Neuromuscular re-education;Patient/family education;Orthotic Fit/Training;Manual techniques;Passive range of motion;Energy conservation;Spinal Manipulations;Joint Manipulations    PT Next Visit Plan Continue with back strengthening exercise             Patient will benefit from skilled therapeutic intervention in order to improve the following deficits and impairments:  Decreased activity tolerance, Pain  Visit Diagnosis: Chronic right-sided low back pain, unspecified whether sciatica present     Problem List Patient Active Problem List   Diagnosis Date Noted   Cervicalgia 01/06/2017   Acute pain  of right shoulder 01/06/2017   Hypercholesterolemia 11/19/2016    Norman Herrlich, PT 12/20/2020, 1:30 PM  [place] [facility] MAIN [facility] SERVICES [address] [place], [place], [address] Phone: [phone]   Fax:  [phone]  Name: Melody Steele MRN: [id number] Date of Birth: [birth year]

## 2020-12-25 ENCOUNTER — Ambulatory Visit: Payer: Medicare HMO | Admitting: Physical Therapy

## 2020-12-25 ENCOUNTER — Other Ambulatory Visit: Payer: Self-pay

## 2020-12-25 DIAGNOSIS — M545 Low back pain, unspecified: Secondary | ICD-10-CM | POA: Diagnosis not present

## 2020-12-25 DIAGNOSIS — G8929 Other chronic pain: Secondary | ICD-10-CM

## 2020-12-25 NOTE — Therapy (Signed)
[place] [facility] MAIN [facility] SERVICES [address] [place], [place], [address] Phone: [phone]   Fax:  [phone]  Physical Therapy Treatment  Patient Details  Name: Melody Steele MRN: [id number] Date of Birth: [birth year] Referring Provider (PT): Doneen Poisson   Encounter Date: 12/25/2020   PT End of Session - 12/25/20 0959     Visit Number 6    Number of Visits 20    Date for PT Re-Evaluation 01/22/21    Authorization Type [employer] Medicare    Authorization Time Period 11/13/20-01/22/21    Progress Note Due on Visit 10    PT Start Time 0934    PT Stop Time 1013    PT Time Calculation (min) 39 min             Past Medical History:  Diagnosis Date   Allergic rhinitis    Arthritis    History of mammogram 2011; 08/29/13; 09/12/14   bibc neg; neg; neg   History of Papanicolaou smear of cervix 04/29/11; 08/29/13   -/-; -/-   Hypertension    Osteoarthritis    Osteopenia    -2.46 z score    Past Surgical History:  Procedure Laterality Date   APPENDECTOMY     AUGMENTATION MAMMAPLASTY Bilateral 1982   Taken out 1995   BILATERAL SALPINGOOPHORECTOMY  2003   COLONOSCOPY  07/2015   FOOT SURGERY  11/2011   HAND SURGERY     thumb   HYSTEROSCOPY  2003   [facility]   LUMBAR DISC SURGERY  2006   SHOULDER ARTHROSCOPY  2008   SCRAPED BURSAL SAC    There were no vitals filed for this visit.   Subjective Assessment - 12/25/20 0938     Subjective Pt reports her back was very sore following the last physical therpay session. Reported difficulty sleeping ddue to pain and discomfort.    Patient is accompained by: Family member    Pertinent History Pt reports pain in her back has been present for nearly a year. Pt reports occassional catch when she is walking. Pt reports medicine from MD was not all that beneficial. Pt reports she has arthritis in multiple joints including her knees and hips. Pt reports she completes regular walking and complete  about a mile every morning. Pt also reports she has been going to the gym and utilizing the machines and walking but does not perform any regular stretching. Pt reports crunches and sit up activities cause pain in her low back. Pt reports pain is most noticeable when she goes to bed, prior to going to the MD pt could not find any comfortable position to sleep in. Pt states last several weeks her pain has ipmroved at night but still has imcreased pain when laying on her stomach.    Limitations Other (comment)    How long can you sit comfortably? 30 minutes in normal position    How long can you stand comfortably? standing for 30 minutes pt reports would begin to feel uncomfortable    Currently in Pain? Yes    Pain Score 3     Pain Location Back    Pain Onset More than a month ago              Therex:    Bird-dog using lower extremities only 2x6 with ea LE with PVC pipe on back to ensure patient kept her back aligned in neutral position.  Some discomfort noted with right lower extremity as stability side  as well as with right lower extremity in extension patient reported this seemed to reproduce her pain that exercise was ceased as a result will be reassessed at the next session.  -tactile cues for proper alignment of spine  -Hands on dumbbells bilaterally to improve wrist comfort.  Dead bug in supine x10 on each side.  Right supine hamstring stretch times 45 seconds x 2 repetitions, patient reported improvement in signs and symptoms following this activity was also encouraged to perform this more often as this is part of her home exercise program           Manual Therapy:  Performed several transverse and PA spinal mobilizations grade 2 and 3.  Performed at vertebrae L1-L5 for about 1-2 minutes at each site and with each technique.  Patient noted right transverse mobilizations felt good and felt like something was" being pushed back into the correct place".   Following previous  physical therapy session patient reported she thought manual therapy could have caused her increased pain in the following night.  Patient requested certain manual therapy techniques be eliminated the session in order to assess if these were causing her pain or if her pain was from other sources.  PT made sure to check in frequently throughout this therapy session and lesser. Therapy session patient did not report any increase in pain during manual therapy         Pt educated throughout session about proper posture and technique with exercises. Improved exercise technique, movement at target joints, use of target muscles after min to mod verbal, visual, tactile cues.                              PT Education - 12/25/20 0953     Education Details Educated regardiung diferentiatino between DOMS and pain    Person(s) Educated Patient    Methods Explanation    Comprehension Verbalized understanding              PT Short Term Goals - 11/13/20 1410       PT SHORT TERM GOAL #1   Title Patient will increase FOTO score to equal to or greater than     to demonstrate statistically significant improvement in mobility and quality of life.    Baseline 56    Time 4    Period Weeks      PT SHORT TERM GOAL #2   Title Patient will be independent in home exercise program to improve strength/mobility for better functional independence with ADLs and improved comfort with sleep    Baseline Pt does not have HEP    Time 3    Period Weeks    Status New    Target Date 12/04/20               PT Long Term Goals - 11/13/20 1412       PT LONG TERM GOAL #1   Title Patient will increase FOTO score to equal to or greater than  64   to demonstrate statistically significant improvement in mobility and quality of life.    Baseline 56    Time 8    Period Weeks    Status New    Target Date 01/08/21      PT LONG TERM GOAL #2   Title Patient will be able to perform household  work/ chores without increase in symptoms.    Baseline Pt has pain an ddiscofort  with activities such as vaccuming    Time 8    Period Weeks    Status New    Target Date 01/08/21      PT LONG TERM GOAL #3   Title Pt will report ability to sleep through the night without reports of low back pain disturbing her sleep for 3 consecutive nights in order to improve her QOL.    Baseline Pt reports pain that disturbs her sleep on mst nights    Time 10    Period Weeks    Status New    Target Date 01/22/21                   Plan - 12/25/20 1000     Clinical Impression Statement Patient tolerated treatment session well was able to tolerate therapeutic exercise and neuromuscular reeducation activities with better efficacy. Reviewed HEP as pt reports she has not been copmliant with HEP to this point. Pt reports hamstring stretch felt some improvement in tightness and discomfort and she was encouraged to begin doing this at home as part of her HEP which is already been prescribed. We will continue to benefit from skilled physical therapy intervention to improve her low back pain improve her core strength and improve her overall quality of life.    Personal Factors and Comorbidities Time since onset of injury/illness/exacerbation    Examination-Activity Limitations Stand;Bed Mobility;Sleep    Examination-Participation Restrictions Cleaning    Stability/Clinical Decision Making Stable/Uncomplicated    Rehab Potential Good    PT Frequency 2x / week    PT Duration Other (comment)    PT Treatment/Interventions ADLs/Self Care Home Management;Functional mobility training;Ultrasound;Moist Heat;Electrical Stimulation;Biofeedback;Therapeutic activities;Therapeutic exercise;Balance training;Neuromuscular re-education;Patient/family education;Orthotic Fit/Training;Manual techniques;Passive range of motion;Energy conservation;Spinal Manipulations;Joint Manipulations    PT Next Visit Plan Continue with back  strengthening exercise             Patient will benefit from skilled therapeutic intervention in order to improve the following deficits and impairments:  Decreased activity tolerance, Pain  Visit Diagnosis: Chronic right-sided low back pain, unspecified whether sciatica present     Problem List Patient Active Problem List   Diagnosis Date Noted   Cervicalgia 01/06/2017   Acute pain of right shoulder 01/06/2017   Hypercholesterolemia 11/19/2016    Norman Herrlich, PT 12/25/2020, 10:05 AM   [facility] MAIN [facility] SERVICES [address] [place], [place], [address] Phone: [phone]   Fax:  [phone]  Name: Melody Steele MRN: [id number] Date of Birth: [birth year]

## 2020-12-27 ENCOUNTER — Ambulatory Visit: Payer: Medicare HMO | Admitting: Physical Therapy

## 2021-01-01 ENCOUNTER — Ambulatory Visit: Payer: Medicare HMO | Attending: Orthopaedic Surgery | Admitting: Physical Therapy

## 2021-01-01 ENCOUNTER — Other Ambulatory Visit: Payer: Self-pay

## 2021-01-01 DIAGNOSIS — M545 Low back pain, unspecified: Secondary | ICD-10-CM | POA: Diagnosis present

## 2021-01-01 DIAGNOSIS — G8929 Other chronic pain: Secondary | ICD-10-CM | POA: Diagnosis present

## 2021-01-01 NOTE — Therapy (Signed)
[facility] MAIN [facility] SERVICES [address] [place], [place], [address] Phone: [phone]   Fax:  [phone]  Physical Therapy Treatment  Patient Details  Name: Melody Steele MRN: [id number] Date of Birth: 1956/01/18 Referring Provider (PT): Doneen Poisson   Encounter Date: 01/01/2021   PT End of Session - 01/01/21 0953     Visit Number 7    Number of Visits 20    Date for PT Re-Evaluation 01/22/21    Authorization Type [employer]    Authorization Time Period 11/13/20-01/22/21    Progress Note Due on Visit 10    Activity Tolerance Patient tolerated treatment well;No increased pain    Behavior During Therapy [facility] for tasks assessed/performed             Past Medical History:  Diagnosis Date   Allergic rhinitis    Arthritis    History of mammogram 2011; 08/29/13; 09/12/14   bibc neg; neg; neg   History of Papanicolaou smear of cervix 04/29/11; 08/29/13   -/-; -/-   Hypertension    Osteoarthritis    Osteopenia    -2.46 z score    Past Surgical History:  Procedure Laterality Date   APPENDECTOMY     AUGMENTATION MAMMAPLASTY Bilateral 1982   Taken out 1995   BILATERAL SALPINGOOPHORECTOMY  2003   COLONOSCOPY  07/2015   FOOT SURGERY  11/2011   HAND SURGERY     thumb   [facility]COPY  2003   [facility]   LUMBAR DISC SURGERY  2006   SHOULDER ARTHROSCOPY  2008   SCRAPED BURSAL SAC    There were no vitals filed for this visit.   Subjective Assessment - 01/01/21 0939     Subjective Pt reports her back was not as sore following the previous physical therpay session. Reports continued low back pain.    Patient is accompained by: Family member    Pertinent History Pt reports pain in her back has been present for nearly a year. Pt reports occassional catch when she is walking. Pt reports medicine from MD was not all that beneficial. Pt reports she has arthritis in multiple joints including her knees and hips. Pt reports she  completes regular walking and complete about a mile every morning. Pt also reports she has been going to the gym and utilizing the machines and walking but does not perform any regular stretching. Pt reports crunches and sit up activities cause pain in her low back. Pt reports pain is most noticeable when she goes to bed, prior to going to the MD pt could not find any comfortable position to sleep in. Pt states last several weeks her pain has ipmroved at night but still has imcreased pain when laying on her stomach.    Limitations Other (comment)    How long can you sit comfortably? 30 minutes in normal position    How long can you stand comfortably? standing for 30 minutes pt reports would begin to feel uncomfortable    Currently in Pain? Yes    Pain Score 5     Pain Location Back    Pain Orientation Right;Posterior    Pain Onset More than a month ago             Treatment provided this session  Therex:  Dead bug in supine x10 on each side. -no pain noted, began each rep with feet flat on table and arms on table -VC for proper LE movement  Bird dog,  LE only 4 x ea LE -pain noted on R side with R sided hip extension so this exercises was ceased as a result   Right supine hamstring stretch times 45 seconds x 3 repetitions, patient reported improvement in signs and symptoms following this activity was also encouraged to perform this more often as this is part of her home exercise program  R sided standing QL stretch (R LE crossed behind L, R hand behind neck for neck support)  -3 x 45 sec, slight improvement in stiffness in low back following -handout provided to add to HEP  Seated piriformis stretch -3 x 45 sec -cues for neck positioning to improve neck discomfort felt on first stretch   Manual Therapy:  Grade 1/2 joint mobilizations to L1-L5 to improve pain as well as improve joint mobility  -x 10 min, no increase in pain reported      Pt educated throughout session  about proper posture and technique with exercises. Improved exercise technique, movement at target joints, use of target muscles after min to mod verbal, visual, tactile cues.  Note: Portions of this document were prepared using Dragon voice recognition software and although reviewed may contain unintentional dictation errors in syntax, grammar, or spelling.                           PT Education - 01/01/21 0951     Education Details Pt educated regarding safe options for gym exercises to prevent further back pain    Person(s) Educated Patient    Methods Explanation    Comprehension Verbalized understanding              PT Short Term Goals - 11/13/20 1410       PT SHORT TERM GOAL #1   Title Patient will increase FOTO score to equal to or greater than     to demonstrate statistically significant improvement in mobility and quality of life.    Baseline 56    Time 4    Period Weeks      PT SHORT TERM GOAL #2   Title Patient will be independent in home exercise program to improve strength/mobility for better functional independence with ADLs and improved comfort with sleep    Baseline Pt does not have HEP    Time 3    Period Weeks    Status New    Target Date 12/04/20               PT Long Term Goals - 11/13/20 1412       PT LONG TERM GOAL #1   Title Patient will increase FOTO score to equal to or greater than  64   to demonstrate statistically significant improvement in mobility and quality of life.    Baseline 56    Time 8    Period Weeks    Status New    Target Date 01/08/21      PT LONG TERM GOAL #2   Title Patient will be able to perform household work/ chores without increase in symptoms.    Baseline Pt has pain an ddiscofort with activities such as vaccuming    Time 8    Period Weeks    Status New    Target Date 01/08/21      PT LONG TERM GOAL #3   Title Pt will report ability to sleep through the night without reports of low  back pain disturbing her sleep for 3  consecutive nights in order to improve her QOL.    Baseline Pt reports pain that disturbs her sleep on mst nights    Time 10    Period Weeks    Status New    Target Date 01/22/21                   Plan - 01/01/21 0954     Clinical Impression Statement Patient demonstrated good ability to complete therapeutic exercise in today's treatment session.  Pt continues to have pain with bird dog exercise utilizing LE only and reports pain in QL region of lower back. Pt able to complete stretches with min need for cueing but still requires cues for proper hold times as well as for proper form. Pt did not report any increase in pain low back pain with other exercises preformed but several exercises were modified in order to prevent exacerbation of neck pain.  Patient will continue to benefit from skilled physical therapy intervention in order to improve her low back pain as well as to improve her function with daily activities and limit pain with these daily activities.    Personal Factors and Comorbidities Time since onset of injury/illness/exacerbation    Examination-Activity Limitations Stand;Bed Mobility;Sleep    Examination-Participation Restrictions Cleaning    Stability/Clinical Decision Making Stable/Uncomplicated    Rehab Potential Good    PT Frequency 2x / week    PT Duration Other (comment)    PT Treatment/Interventions ADLs/Self Care Home Management;Functional mobility training;Ultrasound;Moist Heat;Electrical Stimulation;Biofeedback;Therapeutic activities;Therapeutic exercise;Balance training;Neuromuscular re-education;Patient/family education;Orthotic Fit/Training;Manual techniques;Passive range of motion;Energy conservation;Spinal Manipulations;Joint Manipulations    PT Next Visit Plan Continue with back strengthening exercise             Patient will benefit from skilled therapeutic intervention in order to improve the following deficits  and impairments:  Decreased activity tolerance, Pain  Visit Diagnosis: Chronic right-sided low back pain, unspecified whether sciatica present     Problem List Patient Active Problem List   Diagnosis Date Noted   Cervicalgia 01/06/2017   Acute pain of right shoulder 01/06/2017   Hypercholesterolemia 11/19/2016    Norman Herrlich, PT 01/01/2021, 11:53 AM  [facility] MAIN [facility] SERVICES [address] [place], [place], [address] Phone: [phone]   Fax:  [phone]  Name: Melody Steele MRN: [id number] Date of Birth: 02/17/1956

## 2021-01-03 ENCOUNTER — Ambulatory Visit: Payer: Medicare HMO | Admitting: Physical Therapy

## 2021-01-08 ENCOUNTER — Ambulatory Visit: Payer: Medicare HMO | Admitting: Physical Therapy

## 2021-01-10 ENCOUNTER — Encounter: Payer: Self-pay | Admitting: Physician Assistant

## 2021-01-10 ENCOUNTER — Ambulatory Visit: Payer: Medicare HMO | Admitting: Physician Assistant

## 2021-01-10 ENCOUNTER — Ambulatory Visit: Payer: Self-pay

## 2021-01-10 ENCOUNTER — Encounter: Payer: Medicare HMO | Admitting: Physical Therapy

## 2021-01-10 DIAGNOSIS — M542 Cervicalgia: Secondary | ICD-10-CM | POA: Diagnosis not present

## 2021-01-10 MED ORDER — METHYLPREDNISOLONE 4 MG PO TABS: ORAL_TABLET | ORAL | 0 refills | Status: DC

## 2021-01-10 NOTE — Addendum Note (Signed)
Addended by: Barbette Or on: 01/10/2021 03:49 PM   Modules accepted: Orders

## 2021-01-10 NOTE — Progress Notes (Signed)
Office Visit Note   Patient: Melody Steele           Date of Birth: 01-31-1956           MRN: [id number] Visit Date: 01/10/2021              Requested by: Jaclyn Shaggy, MD [address]   [place],  [place] [address] PCP: Jaclyn Shaggy, MD   Assessment & Plan: Visit Diagnoses:  1. Neck pain     Plan: Plan we will place her on a Medrol Dosepak starting this coming Tuesday due to her recent vaccine.  She is already on Zanaflex and can continue taking this.  No NSAIDs while on the Medrol Dosepak.  We will refer her to Dr. 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  Questions were encouraged and answered at length.  She will follow-up with [place] as needed.  Follow-Up Instructions: No follow-ups on file.   Orders:  Orders Placed This Encounter  Procedures   XR Cervical Spine 2 or 3 views   Meds ordered this encounter  Medications   methylPREDNISolone (MEDROL) 4 MG tablet    Sig: Take as directed    Dispense:  21 tablet    Refill:  0      Procedures: No procedures performed   Clinical Data: No additional findings.   Subjective: Chief Complaint  Patient presents with   Neck - Pain    HPI Melody Steele for neck pain.  Is been some years since we have seen her for her neck.  She does have a history of neck surgery by Dr. Yetta Barre years ago with a fusion of C5-C6.  No new injury to the neck.  She is now having radicular symptoms with pain down into her forearms but is not extended her hands.  Pain does awaken her.  States pains been as bad as 8 out of 10 pain.  However she has underlying constant pain in her neck and her shoulder region bilaterally.  She is tried naproxen and Advil also daily.  She has been going to therapy for low back and feels that the exercises there increased some of her pain in the neck.  Patient is nondiabetic.  Review of Systems Denies any fevers, chills, had flu vaccine less than a week  ago.  Objective: Vital Signs: There were no vitals taken for this visit.  Physical Exam General: Well-developed well-nourished female no acute distress mood and affect appropriate. Psych: Alert and oriented x3 Vascular radial pulses are 2+ equal and symmetric. Ortho Exam Cervical spine: She has increased pain in her cervical spine with rotation left and also with extension.  Tenderness over the cervical spinal column and into the medial borders of the bilateral scapula.  Upper extremity strength 5 out of 5 strength throughout.  Sensation grossly intact bilateral hands.  Full motor bilateral hands. Specialty Comments:  No specialty comments available.  Imaging: XR Cervical Spine 2 or 3 views  Result Date: 01/10/2021 Cervical spine 2 views: C5-C6 fusion appears solid there is no hardware failure.  There is slight retrolisthesis see 4 on C5.  Degenerative changes at C4 -C5 and C6 -C7.  No acute fractures or bony abnormalities otherwise.    PMFS History: Patient Active Problem List   Diagnosis Date Noted   Cervicalgia 01/06/2017   Acute pain of right shoulder 01/06/2017   Hypercholesterolemia 11/19/2016   Past Medical History:  Diagnosis Date  Allergic rhinitis    Arthritis    History of mammogram 2011; 08/29/13; 09/12/14   bibc neg; neg; neg   History of Papanicolaou smear of cervix 04/29/11; 08/29/13   -/-; -/-   Hypertension    Osteoarthritis    Osteopenia    -2.46 z score    Family History  Problem Relation Age of Onset   Breast cancer Maternal Aunt 42       older than 55   Heart disease Mother    Hypertension Mother    Stroke Mother     Past Surgical History:  Procedure Laterality Date   APPENDECTOMY     AUGMENTATION MAMMAPLASTY Bilateral 1982   Taken out 1995   BILATERAL SALPINGOOPHORECTOMY  2003   COLONOSCOPY  07/2015   FOOT SURGERY  11/2011   HAND SURGERY     thumb   HY[facility]OSCOPY  2003   [facility]   LUMBAR DISC SURGERY  2006   SHOULDER ARTHROSCOPY  2008    SCRAPED BURSAL SAC   Social History   Occupational History   Occupation: [employer]    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

## 2021-01-15 ENCOUNTER — Ambulatory Visit: Payer: Medicare HMO | Admitting: Physical Therapy

## 2021-01-17 ENCOUNTER — Encounter: Payer: Medicare HMO | Admitting: Physical Therapy

## 2021-01-22 ENCOUNTER — Ambulatory Visit: Payer: Medicare HMO | Admitting: Physical Therapy

## 2021-01-24 ENCOUNTER — Encounter: Payer: Medicare HMO | Admitting: Physical Therapy

## 2021-01-29 ENCOUNTER — Ambulatory Visit: Payer: Medicare HMO | Admitting: Physical Therapy

## 2021-01-31 ENCOUNTER — Ambulatory Visit: Payer: Medicare HMO | Admitting: Physical Therapy

## 2021-02-05 ENCOUNTER — Ambulatory Visit: Payer: Medicare HMO | Admitting: Physical Therapy

## 2021-02-12 ENCOUNTER — Ambulatory Visit: Payer: Medicare HMO | Admitting: Physical Therapy

## 2021-02-19 ENCOUNTER — Ambulatory Visit: Payer: Medicare HMO | Admitting: Physical Therapy

## 2021-02-26 ENCOUNTER — Ambulatory Visit: Payer: Medicare HMO | Admitting: Physical Therapy

## 2021-03-05 ENCOUNTER — Ambulatory Visit: Payer: Medicare HMO | Admitting: Physical Therapy

## 2021-03-08 ENCOUNTER — Other Ambulatory Visit: Payer: Self-pay | Admitting: Internal Medicine

## 2021-03-08 DIAGNOSIS — Z1231 Encounter for screening mammogram for malignant neoplasm of breast: Secondary | ICD-10-CM

## 2021-03-11 ENCOUNTER — Telehealth: Payer: Self-pay | Admitting: Physical Medicine and Rehabilitation

## 2021-03-11 NOTE — Telephone Encounter (Signed)
Pt called asking to speak to [facility]. She states her insurance denied back injections but now approved them. Please call pt to set an appt. Pt phone number is [phone].

## 2021-03-12 ENCOUNTER — Ambulatory Visit: Payer: Medicare HMO | Admitting: Physical Therapy

## 2021-03-19 ENCOUNTER — Ambulatory Visit: Payer: Medicare HMO | Admitting: Physical Therapy

## 2021-03-26 ENCOUNTER — Ambulatory Visit: Payer: Medicare HMO | Admitting: Physical Therapy

## 2021-04-03 ENCOUNTER — Telehealth: Payer: Self-pay | Admitting: Physical Medicine and Rehabilitation

## 2021-04-03 NOTE — Telephone Encounter (Signed)
-----   Message from Trommald Lions sent at 04/03/2021 12:06 PM EST ----- Referral

## 2021-04-16 ENCOUNTER — Other Ambulatory Visit: Payer: Self-pay

## 2021-04-16 ENCOUNTER — Ambulatory Visit: Payer: Medicare HMO | Admitting: Physical Medicine and Rehabilitation

## 2021-04-16 ENCOUNTER — Encounter: Payer: Self-pay | Admitting: Physical Medicine and Rehabilitation

## 2021-04-16 VITALS — BP 115/76 | HR 93

## 2021-04-16 DIAGNOSIS — M7918 Myalgia, other site: Secondary | ICD-10-CM | POA: Diagnosis not present

## 2021-04-16 DIAGNOSIS — M47812 Spondylosis without myelopathy or radiculopathy, cervical region: Secondary | ICD-10-CM

## 2021-04-16 DIAGNOSIS — M542 Cervicalgia: Secondary | ICD-10-CM

## 2021-04-16 NOTE — Progress Notes (Signed)
Pt state neck pain that travels to her back of her head. Pt state the pain also travels to both shoulder and arms. Pt state turn her neck and body makes the pain worse. Pt state she takes over the counter pain meds to help ease her pain.  Numeric Pain Rating Scale and Functional Assessment Average Pain 8 Pain Right Now 4 My pain is intermittent, sharp, stabbing, and aching Pain is worse with: bending and some activites Pain improves with: medication   In the last MONTH (on 0-10 scale) has pain interfered with the following?  1. General activity like being  able to carry out your everyday physical activities such as walking, climbing stairs, carrying groceries, or moving a chair?  Rating(7)  2. Relation with others like being able to carry out your usual social activities and roles such as  activities at home, at work and in your community. Rating(8)  3. Enjoyment of life such that you have  been bothered by emotional problems such as feeling anxious, depressed or irritable?  Rating(9)

## 2021-04-16 NOTE — Progress Notes (Signed)
Melody Steele - 66 y.o. female MRN [id number]  Date of birth: [birth year]  Office Visit Note: Visit Date: 04/16/2021 PCP: Jaclyn Shaggy, MD Referred by: Jaclyn Shaggy, MD  Subjective: Chief Complaint  Patient presents with   Neck - Pain   Head - Pain   Right Shoulder - Pain   Left Shoulder - Pain   Right Arm - Pain   Left Arm - Pain   HPI: Melody Steele is a 66 y.o. female who comes in today per the request of Dr. Marikay Alar for evaluation of chronic, worsening and severe bilateral neck pain radiating to shoulders. Patient reports pain has been ongoing for several months. Patient states pain is exacerbated by movement and activity, describes her pain as constant soreness sensation, currently rates as 8 out of 10. Patient states some relief of pain with home exercise program, rest, ice and use of Naproxen. Patient states she recently attended formal physical therapy at [facility] for chronic lower back issues, however reports she had to stop therapy due to increased neck discomfort. Patient does have history of ACDF at the level of C5-C6 performed by Dr. Marikay Alar several years ago. Her recent cervical MRI exhibits multi-level facet hypertrophy, C5-C6 ACDF without spinal canal stenosis, mild bilateral foraminal stenosis at C4-C5 and C6-C7 and mild right foraminal stenosis at C7-T1. No high grade spinal canal stenosis noted. Patient was recently seen by Dr. Yetta Barre whom did not recommend surgical intervention at this time but did feel that patient could benefit from cervical facet joint injections. Patient states she is a very active person and does take care of her son that has cerebral palsy. Patient denies focal weakness, numbness and tingling. Patient denies recent trauma or falls.   Review of Systems  Musculoskeletal:  Positive for myalgias and neck pain.  Neurological:  Negative for tingling, sensory change, focal weakness and weakness.  All other systems  reviewed and are negative. Otherwise per HPI.  Assessment & Plan: Visit Diagnoses:    ICD-10-CM   1. Cervicalgia  M54.2 Ambulatory referral to Physical Medicine Rehab    2. Bilateral neck pain  M54.2 Ambulatory referral to Physical Medicine Rehab    3. Facet hypertrophy of cervical region  M47.812 Ambulatory referral to Physical Medicine Rehab    4. Myofascial pain syndrome  M79.18        Plan: Findings:  Chronic, worsening and severe bilateral neck pain radiating to shoulder. Patient continues to have severe pain despite good conservative therapies such as home exercise program, use of ice and medications. Patients clinical presentation and exam are consistent with facet mediated pain pattern with pain provocation with extension rotation particularly to the left.. We also feel that there could be a myofascial component contributing to patients pain. We believe the next step is to perform diagnostic and hopefully therapeutic bilateral C3-C4 facet joint injections under fluoroscopic guidance. There is moderate facet hypertrophy on the left at C3-C4 on recent cervical MRI. We would consider performing cervical epidural steroid injection in the future if warranted. Patient encouraged to remain active and to continue home exercise regimen as tolerated. No red flag symptoms noted upon exam today.    Meds & Orders: No orders of the defined types were placed in this encounter.   Orders Placed This Encounter  Procedures   Ambulatory referral to Physical Medicine Rehab    Follow-up: Return for Bilateral C3-C4 facet joint injections.   Procedures: No procedures performed  Clinical History: EXAM: MRI CERVICAL SPINE WITHOUT CONTRAST  TECHNIQUE: Multiplanar, multisequence MR imaging of the cervical spine was performed. No intravenous contrast was administered.  COMPARISON: 11/24/2006  FINDINGS: Alignment: Grade 1 retrolisthesis at C5-6  Vertebrae: No fracture, evidence of discitis,  or bone lesion. C5-6 ACDF.  Cord: Normal  Posterior Fossa, vertebral arteries, paraspinal tissues: Normal  Disc levels:  C1-2: Unremarkable.  C2-3: Normal disc space and facet joints. There is no spinal canal stenosis. No neural foraminal stenosis.  C3-4: Moderate left facet hypertrophy, mildly progressed. There is no spinal canal stenosis. No neural foraminal stenosis.  C4-5: Small disc bulge with bilateral uncovertebral hypertrophy. There is no spinal canal stenosis. Mild bilateral neural foraminal stenosis.  C5-6: ACDF. There is no spinal canal stenosis. No neural foraminal stenosis.  C6-7: Mild disc bulge with uncovertebral spurring. There is no spinal canal stenosis. Mild bilateral neural foraminal stenosis.  C7-T1: Right facet hypertrophy. There is no spinal canal stenosis. Mild right neural foraminal stenosis.  IMPRESSION: 1. C5-6 ACDF without spinal canal stenosis. 2. Mild bilateral neural foraminal stenosis at C4-5 and C6-7. The C6-7 stenosis has progressed. 3. Mild right C7-T1 neural foraminal stenosis, new.   Electronically Signed By: Deatra Robinson M.D. On: 03/07/2021 19:46   She reports that she has quit smoking. She has never used smokeless tobacco. No results for input(s): HGBA1C, LABURIC in the last 8760 hours.  Objective:  VS:  HT:     WT:    BMI:      BP:115/76   HR:93bpm   TEMP: ( )   RESP:  Physical Exam Vitals and nursing note reviewed.  HENT:     Head: Normocephalic and atraumatic.     Right Ear: External ear normal.     Left Ear: External ear normal.     Nose: Nose normal.     Mouth/Throat:     Mouth: Mucous membranes are moist.  Eyes:     Extraocular Movements: Extraocular movements intact.  Cardiovascular:     Rate and Rhythm: Normal rate.     Pulses: Normal pulses.  Pulmonary:     Effort: Pulmonary effort is normal.  Abdominal:     General: Abdomen is flat. There is no distension.  Musculoskeletal:        General: Tenderness  present.     Cervical back: Tenderness present.     Comments: Discomfort noted with flexion, extension and side-to-side rotation. Patient has good strength in the upper extremities including 5 out of 5 strength in wrist extension, long finger flexion and APB.  There is no atrophy of the hands intrinsically.  Sensation intact bilaterally. Negative Hoffman's sign.   Skin:    General: Skin is warm and dry.     Capillary Refill: Capillary refill takes less than 2 seconds.  Neurological:     General: No focal deficit present.     Mental Status: She is alert and oriented to person, place, and time.  Psychiatric:        Mood and Affect: Mood normal.        Behavior: Behavior normal.    Ortho Exam  Imaging: No results found.  Past Medical/Family/Surgical/Social History: Medications & Allergies reviewed per EMR, new medications updated. Patient Active Problem List   Diagnosis Date Noted   Cervicalgia 01/06/2017   Acute pain of right shoulder 01/06/2017   Hypercholesterolemia 11/19/2016   Past Medical History:  Diagnosis Date   Allergic rhinitis    Arthritis    History of mammogram  2011; 08/29/13; 09/12/14   bibc neg; neg; neg   History of Papanicolaou smear of cervix 04/29/11; 08/29/13   -/-; -/-   Hypertension    Osteoarthritis    Osteopenia    -2.46 z score   Family History  Problem Relation Age of Onset   Breast cancer Maternal Aunt 2755       older than 7184   Heart disease Mother    Hypertension Mother    Stroke Mother    Past Surgical History:  Procedure Laterality Date   APPENDECTOMY     AUGMENTATION MAMMAPLASTY Bilateral 1982   Taken out 1995   BILATERAL SALPINGOOPHORECTOMY  2003   COLONOSCOPY  07/2015   FOOT SURGERY  11/2011   HAND SURGERY     thumb   [facility]   LUMBAR DISC SURGERY  2006   SHOULDER ARTHROSCOPY  2008   SCRAPED BURSAL SAC   Social History   Occupational History   Occupation: [employer]    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

## 2021-05-02 ENCOUNTER — Ambulatory Visit
Admission: RE | Admit: 2021-05-02 | Discharge: 2021-05-02 | Disposition: A | Discharge status: Home / Self Care | Payer: Medicare HMO | Source: Ambulatory Visit | Attending: Internal Medicine | Admitting: Internal Medicine

## 2021-05-02 ENCOUNTER — Telehealth: Payer: Self-pay

## 2021-05-02 ENCOUNTER — Other Ambulatory Visit: Payer: Self-pay

## 2021-05-02 DIAGNOSIS — Z1231 Encounter for screening mammogram for malignant neoplasm of breast: Secondary | ICD-10-CM | POA: Insufficient documentation

## 2021-05-02 NOTE — Telephone Encounter (Signed)
Patient called into the office to get an update on the shots she is suppose to get in her neck. Please advise

## 2021-05-21 ENCOUNTER — Encounter: Payer: Self-pay | Admitting: Physical Medicine and Rehabilitation

## 2021-05-21 ENCOUNTER — Other Ambulatory Visit: Payer: Self-pay

## 2021-05-21 ENCOUNTER — Ambulatory Visit: Payer: Self-pay

## 2021-05-21 ENCOUNTER — Ambulatory Visit: Payer: Medicare HMO | Admitting: Physical Medicine and Rehabilitation

## 2021-05-21 VITALS — BP 158/73 | HR 94

## 2021-05-21 DIAGNOSIS — M47812 Spondylosis without myelopathy or radiculopathy, cervical region: Secondary | ICD-10-CM

## 2021-05-21 MED ORDER — BUPIVACAINE HCL 0.25 % IJ SOLN
2.0000 mL | Freq: Once | INTRAMUSCULAR | Status: AC
  Administered 2021-05-21: 2 mL

## 2021-05-21 NOTE — Patient Instructions (Signed)

## 2021-05-21 NOTE — Progress Notes (Signed)
Pt state neck pain that travels to her back of her head. Pt state the pain also travels to both shoulder and arms. Pt state turn her neck and body makes the pain worse. Pt state she takes over the counter pain meds to help ease her pain.  Numeric Pain Rating Scale and Functional Assessment Average Pain 5   In the last MONTH (on 0-10 scale) has pain interfered with the following?  1. General activity like being  able to carry out your everyday physical activities such as walking, climbing stairs, carrying groceries, or moving a chair?  Rating(8)   +Driver, -BT, -Dye Allergies.

## 2021-05-30 NOTE — Progress Notes (Signed)
Melody Steele - 66 y.o. female MRN [id number]  Date of birth: 1955-11-01  Office Visit Note: Visit Date: 05/21/2021 PCP: Jaclyn Shaggy, MD Referred by: Tia Alert, MD  Subjective: Chief Complaint  Patient presents with   Neck - Pain   Right Shoulder - Pain   Left Shoulder - Pain   Head - Pain   HPI:  Melody Steele is a 66 y.o. female who comes in today at the request of Ellin Goodie, FNP for planned Bilateral  C3-4 Cervical facet/medial branch block with fluoroscopic guidance.  The patient has failed conservative care including home exercise, medications, time and activity modification.  This injection will be diagnostic and hopefully therapeutic.  Please see requesting physician notes for further details and justification.  Exam has shown concordant pain with facet joint loading.   Referring provider: Dr. Doneen Poisson, Dr. Marikay Alar   ROS Otherwise per HPI.  Assessment & Plan: Visit Diagnoses:    ICD-10-CM   1. Facet hypertrophy of cervical region  M47.812 XR C-ARM NO REPORT    Facet Injection    bupivacaine (MARCAINE) 0.25 % (with pres) injection 2 mL      Plan: No additional findings.   Meds & Orders:  Meds ordered this encounter  Medications   bupivacaine (MARCAINE) 0.25 % (with pres) injection 2 mL    Orders Placed This Encounter  Procedures   Facet Injection   XR C-ARM NO REPORT    Follow-up: Return if symptoms worsen or fail to improve.   Procedures: No procedures performed  Diagnostic Cervical Facet Joint Nerve Block with Fluoroscopic Guidance  Patient: Melody Steele      Date of Birth: 1955-08-01 MRN: [id number] PCP: Jaclyn Shaggy, MD      Visit Date: 05/21/2021   Universal Protocol:    Date/Time: 03/02/235:41 AM  Consent Given By: the patient  Position: PRONE  Additional Comments: Vital signs were monitored before and after the procedure. Patient was prepped and draped in the usual sterile fashion. The correct patient,  procedure, and site was verified.   Injection Procedure Details:   Procedure diagnoses: Facet hypertrophy of cervical region [M47.812]   Meds Administered:  Meds ordered this encounter  Medications   bupivacaine (MARCAINE) 0.25 % (with pres) injection 2 mL     Laterality: Bilateral  Location/Site:  C3-4  Needle size: 25 G  Needle type: Spinal  Needle Placement: Articular Pillar  Findings:  -Contrast Used: 0.5 mL iohexol 180 mg iodine/mL   -Comments: Excellent flow of contrast across the articular pillars without intravascular flow  Procedure Details: The fluoroscope beam was positioned to square off the endplates of the desired vertebral level to achieve a true AP position. The beam was then moved in a small counter oblique to the contralateral side with a small amount of caudal tilt to achieve a trajectory alignment with the desired nerves.  For each target described below the skin was anesthetized with 1 ml of 1% Lidocaine without epinephrine.   To block the facet joint nerves from C3 through C7, the lateral masses of these respective levels were localized under fluoroscopic visualization.  A spinal needle was inserted down to the "waist" at the above mentioned cervical levels.  The  needle was then "walked off" until it rested just lateral to the trough of the lateral mass of the medial branch nerve, which innervates the cervical facet joint.   After contact with periosteum and negative aspirate for blood and CSF,  correct placement without intravascular or epidural spread was confirmed by Bi-planar images and  injecting 0.5 ml. of Omnipaque-240.  A spot radiograph was obtained of this image.  Next, a 0.5 ml. volume of 1% Lidocaine without Epinephrine was then injected.  Prior to the procedure, the patient was given a Pain Diary which was completed for baseline measurements.  After the procedure, the patient rated their pain every 30 minutes and will continue rating at this  frequency for a total of 5 hours.  The patient has been asked to complete the Diary and return to [place] by mail, fax or hand delivered as soon as possible.   Additional Comments:  No complications occurred Dressing: Band-Aid    Post-procedure details: Patient was observed during the procedure. Post-procedure instructions were reviewed.  Patient left the clinic in stable condition.       Clinical History: EXAM: MRI CERVICAL SPINE WITHOUT CONTRAST  TECHNIQUE: Multiplanar, multisequence MR imaging of the cervical spine was performed. No intravenous contrast was administered.  COMPARISON: 11/24/2006  FINDINGS: Alignment: Grade 1 retrolisthesis at C5-6  Vertebrae: No fracture, evidence of discitis, or bone lesion. C5-6 ACDF.  Cord: Normal  Posterior Fossa, vertebral arteries, paraspinal tissues: Normal  Disc levels:  C1-2: Unremarkable.  C2-3: Normal disc space and facet joints. There is no spinal canal stenosis. No neural foraminal stenosis.  C3-4: Moderate left facet hypertrophy, mildly progressed. There is no spinal canal stenosis. No neural foraminal stenosis.  C4-5: Small disc bulge with bilateral uncovertebral hypertrophy. There is no spinal canal stenosis. Mild bilateral neural foraminal stenosis.  C5-6: ACDF. There is no spinal canal stenosis. No neural foraminal stenosis.  C6-7: Mild disc bulge with uncovertebral spurring. There is no spinal canal stenosis. Mild bilateral neural foraminal stenosis.  C7-T1: Right facet hypertrophy. There is no spinal canal stenosis. Mild right neural foraminal stenosis.  IMPRESSION: 1. C5-6 ACDF without spinal canal stenosis. 2. Mild bilateral neural foraminal stenosis at C4-5 and C6-7. The C6-7 stenosis has progressed. 3. Mild right C7-T1 neural foraminal stenosis, new.   Electronically Signed By: Deatra Robinson M.D. On: 03/07/2021 19:46     Objective:  VS:  HT:     WT:    BMI:      BP:(!) 158/73   HR:94bpm    TEMP: ( )   RESP:  Physical Exam Vitals and nursing note reviewed.  Constitutional:      General: She is not in acute distress.    Appearance: Normal appearance. She is not ill-appearing.  HENT:     Head: Normocephalic and atraumatic.     Right Ear: External ear normal.     Left Ear: External ear normal.  Eyes:     Extraocular Movements: Extraocular movements intact.  Cardiovascular:     Rate and Rhythm: Normal rate.     Pulses: Normal pulses.  Musculoskeletal:     Cervical back: Tenderness present. No rigidity.     Right lower leg: No edema.     Left lower leg: No edema.     Comments: Patient has good strength in the upper extremities including 5 out of 5 strength in wrist extension long finger flexion and APB.  There is no atrophy of the hands intrinsically.  There is a negative Hoffmann's test.   Lymphadenopathy:     Cervical: No cervical adenopathy.  Skin:    Findings: No erythema, lesion or rash.  Neurological:     General: No focal deficit present.     Mental Status: She  is alert and oriented to person, place, and time.     Sensory: No sensory deficit.     Motor: No weakness or abnormal muscle tone.     Coordination: Coordination normal.  Psychiatric:        Mood and Affect: Mood normal.        Behavior: Behavior normal.     Imaging: No results found.

## 2021-05-30 NOTE — Procedures (Signed)
Diagnostic Cervical Facet Joint Nerve Block with Fluoroscopic Guidance  Patient: Melody Steele      Date of Birth: 1956-02-15 MRN: [id number] PCP: Albina Billet, MD      Visit Date: 05/21/2021   Universal Protocol:    Date/Time: 03/02/235:41 AM  Consent Given By: the patient  Position: PRONE  Additional Comments: Vital signs were monitored before and after the procedure. Patient was prepped and draped in the usual sterile fashion. The correct patient, procedure, and site was verified.   Injection Procedure Details:   Procedure diagnoses: Facet hypertrophy of cervical region [M47.812]   Meds Administered:  Meds ordered this encounter  Medications   bupivacaine (MARCAINE) 0.25 % (with pres) injection 2 mL     Laterality: Bilateral  Location/Site:  C3-4  Needle size: 25 G  Needle type: Spinal  Needle Placement: Articular Pillar  Findings:  -Contrast Used: 0.5 mL iohexol 180 mg iodine/mL   -Comments: Excellent flow of contrast across the articular pillars without intravascular flow  Procedure Details: The fluoroscope beam was positioned to square off the endplates of the desired vertebral level to achieve a true AP position. The beam was then moved in a small counter oblique to the contralateral side with a small amount of caudal tilt to achieve a trajectory alignment with the desired nerves.  For each target described below the skin was anesthetized with 1 ml of 1% Lidocaine without epinephrine.   To block the facet joint nerves from C3 through C7, the lateral masses of these respective levels were localized under fluoroscopic visualization.  A spinal needle was inserted down to the "waist" at the above mentioned cervical levels.  The  needle was then "walked off" until it rested just lateral to the trough of the lateral mass of the medial branch nerve, which innervates the cervical facet joint.   After contact with periosteum and negative aspirate for blood and  CSF, correct placement without intravascular or epidural spread was confirmed by Bi-planar images and  injecting 0.5 ml. of Omnipaque-240.  A spot radiograph was obtained of this image.  Next, a 0.5 ml. volume of 1% Lidocaine without Epinephrine was then injected.  Prior to the procedure, the patient was given a Pain Diary which was completed for baseline measurements.  After the procedure, the patient rated their pain every 30 minutes and will continue rating at this frequency for a total of 5 hours.  The patient has been asked to complete the Diary and return to [place] by mail, fax or hand delivered as soon as possible.   Additional Comments:  No complications occurred Dressing: Band-Aid    Post-procedure details: Patient was observed during the procedure. Post-procedure instructions were reviewed.  Patient left the clinic in stable condition.

## 2021-06-11 ENCOUNTER — Other Ambulatory Visit: Payer: Self-pay

## 2021-06-11 ENCOUNTER — Ambulatory Visit: Payer: Medicare HMO | Admitting: Physical Medicine and Rehabilitation

## 2021-06-11 ENCOUNTER — Encounter: Payer: Self-pay | Admitting: Physical Medicine and Rehabilitation

## 2021-06-11 VITALS — BP 145/79 | HR 83

## 2021-06-11 DIAGNOSIS — M5416 Radiculopathy, lumbar region: Secondary | ICD-10-CM

## 2021-06-11 DIAGNOSIS — M7918 Myalgia, other site: Secondary | ICD-10-CM

## 2021-06-11 DIAGNOSIS — M47812 Spondylosis without myelopathy or radiculopathy, cervical region: Secondary | ICD-10-CM

## 2021-06-11 DIAGNOSIS — M542 Cervicalgia: Secondary | ICD-10-CM | POA: Diagnosis not present

## 2021-06-11 NOTE — Progress Notes (Signed)
Pt has hx of inj on 05/21/21 pt state it helped a lot. Pt state she still have sum pain in her neck the past couple days. ?

## 2021-06-11 NOTE — Progress Notes (Signed)
? ?Melody Steele - 66 y.o. female MRN [id number]  Date of birth: 25-Jun-1955 ? ?Office Visit Note: ?Visit Date: 06/11/2021 ?PCP: Jaclyn Shaggy, MD ?Referred by: Jaclyn Shaggy, MD ? ?Subjective: ?Chief Complaint  ?Patient presents with  ? Neck - Pain  ? ?HPI: Melody Steele is a 66 y.o. female who comes in today for evaluation of chronic bilateral neck pain radiating to shoulders. Pain has been ongoing for several months. Patient had bilateral C3-C4 facet joint/medial branch blocks on 05/21/2021 and reports significant and sustained pain relief (greater than 90%) with this procedure. Patient reports pain is exacerbated by movement and activity, describes as soreness sensation, currently rates as 2 out of 10. Patient reports some relief of pain with continued home exercise regimen, ice/heat and use of medications. Patient takes Zanaflex and Naproxen as needed. Patient recently attended formal physical therapy at [facility] for chronic lower back issues, however reports she had to stop therapy due to increased neck discomfort. Patient does have history of ACDF at the level of C5-C6 performed by Dr. Marikay Alar several years ago. Patients cervical MRI from 2022 exhibits multi-level facet hypertrophy, C5-C6 ACDF without spinal canal stenosis, mild bilateral foraminal stenosis at C4-C5 and C6-C7 and mild right foraminal stenosis at C7-T1. No high grade spinal canal stenosis noted. Patient states she is a very active person and does take care of her son that has cerebral palsy. Patient has upcoming follow up appointment scheduled with Dr. Yetta Barre.  ? ?Patient states her chronic lower back pain continues to be manageable at home, however she is inquiring about re-grouping with physical therapy as needed in the future. Patient states she was unable to continue physical therapy for her lower back previously due to worsening neck pain.  ? ?Patient denies focal weakness, numbness and tingling. Patient denies  recent trauma or falls.  ? ? ? ? ?Review of Systems  ?Musculoskeletal:  Positive for back pain and neck pain.  ?Neurological:  Negative for tingling, sensory change, focal weakness and weakness.  ?All other systems reviewed and are negative. Otherwise per HPI. ? ?Assessment & Plan: ?Visit Diagnoses:  ?  ICD-10-CM   ?1. Cervicalgia  M54.2   ?  ?2. Bilateral neck pain  M54.2   ?  ?3. Facet hypertrophy of cervical region  M47.812   ?  ?4. Myofascial pain syndrome  M79.18   ?  ?5. Lumbar radiculopathy  M54.16   ?  ?   ?Plan: Findings:  ?1. Chronic, worsening and severe bilateral neck pain to shoulder. Patient had good pain relief with recent bilateral C3-C4 facet joint/medial branch blocks. Patient continues with conservative therapies at home such as home exercise regimen, ice/heat and medications. Patients clinical presentation and exam remain consistent with facet mediated pain. We feel the next step is to continue to monitor. We would consider repeating facet blocks if needed.  ? ?2. Chronic bilateral lower back pain with referral to right leg. Patient continues to manage lower back pain at home. We did discuss possibility of re-grouping with our in house physical therapy team as needed. We also discussed repeating lumbar epidural steroid injection if warranted. Patient is agreeable with plan and will let us know if her lower back pain increases or changes in nature.  ? ?No red flag symptoms noted upon exam. Patient instructed to follow-up as needed.   ? ?Meds & Orders: No orders of the defined types were placed in this encounter. ? No orders of the  defined types were placed in this encounter. ?  ?Follow-up: Return if symptoms worsen or fail to improve.  ? ?Procedures: ?No procedures performed  ?   ? ?Clinical History: ?No specialty comments available.  ? ?She reports that she has quit smoking. She has never used smokeless tobacco. No results for input(s): HGBA1C, LABURIC in the last 8760 hours. ? ?Objective:  VS:   HT:    WT:   BMI:     BP:(!) 145/79  HR:83bpm  TEMP: ( )  RESP:  ?Physical Exam ?Vitals and nursing note reviewed.  ?HENT:  ?   Head: Normocephalic and atraumatic.  ?   Right Ear: External ear normal.  ?   Left Ear: External ear normal.  ?   Nose: Nose normal.  ?   Mouth/Throat:  ?   Mouth: Mucous membranes are moist.  ?Eyes:  ?   Pupils: Pupils are equal, round, and reactive to light.  ?Cardiovascular:  ?   Rate and Rhythm: Normal rate.  ?   Pulses: Normal pulses.  ?Pulmonary:  ?   Effort: Pulmonary effort is normal.  ?Abdominal:  ?   General: Abdomen is flat. There is no distension.  ?Musculoskeletal:     ?   General: Tenderness present.  ?   Cervical back: Normal range of motion.  ?   Comments: No discomfort noted with flexion, extension and side-to-side rotation. Patient has good strength in the upper extremities including 5 out of 5 strength in wrist extension, long finger flexion and APB.  There is no atrophy of the hands intrinsically. Sensation intact bilaterally. Negative Hoffman's sign. ? ?Pt rises from seated position to standing without difficulty. Good lumbar range of motion. Strong distal strength without clonus, no pain upon palpation of greater trochanters. Sensation intact bilaterally. Walks independently, gait steady.   ?Skin: ?   General: Skin is warm and dry.  ?   Capillary Refill: Capillary refill takes less than 2 seconds.  ?Neurological:  ?   General: No focal deficit present.  ?   Mental Status: She is alert and oriented to person, place, and time.  ?Psychiatric:     ?   Mood and Affect: Mood normal.     ?   Behavior: Behavior normal.  ?  ?Ortho Exam ? ?Imaging: ?No results found. ? ?Past Medical/Family/Surgical/Social History: ?Medications & Allergies reviewed per EMR, new medications updated. ?Patient Active Problem List  ? Diagnosis Date Noted  ? Cervicalgia 01/06/2017  ? Acute pain of right shoulder 01/06/2017  ? Hypercholesterolemia 11/19/2016  ? ?Past Medical History:  ?Diagnosis  Date  ? Allergic rhinitis   ? Arthritis   ? History of mammogram 2011; 08/29/13; 09/12/14  ? bibc neg; neg; neg  ? History of Papanicolaou smear of cervix 04/29/11; 08/29/13  ? -/-; -/-  ? Hypertension   ? Osteoarthritis   ? Osteopenia   ? -2.46 z score  ? ?Family History  ?Problem Relation Age of Onset  ? Breast cancer Maternal Aunt 1755  ?     older than 6484  ? Heart disease Mother   ? Hypertension Mother   ? Stroke Mother   ? ?Past Surgical History:  ?Procedure Laterality Date  ? APPENDECTOMY    ? AUGMENTATION MAMMAPLASTY Bilateral 1982  ? Taken out 1995  ? BILATERAL SALPINGOOPHORECTOMY  2003  ? COLONOSCOPY  07/2015  ? FOOT SURGERY  11/2011  ? HAND SURGERY    ? thumb  ? HYSTEROSCOPY  2003  ? [facility]  ?  LUMBAR DISC SURGERY  2006  ? SHOULDER ARTHROSCOPY  2008  ? SCRAPED BURSAL SAC  ? ?Social History  ? ?Occupational History  ? Occupation: [employer]  ?  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 Smoking status: Former  ? Smokeless tobacco: Never  ? Tobacco comments:  ?  QUIT DATE:2003  ?Vaping Use  ? Vaping Use: Never used  ?Substance and Sexual Activity  ? Alcohol use: No  ? Drug use: No  ? Sexual activity: Never  ?  Birth control/protection: Surgical  ? ? ?

## 2021-06-18 ENCOUNTER — Other Ambulatory Visit: Payer: Self-pay

## 2021-06-18 ENCOUNTER — Encounter: Payer: Self-pay | Admitting: Obstetrics and Gynecology

## 2021-06-18 ENCOUNTER — Ambulatory Visit (INDEPENDENT_AMBULATORY_CARE_PROVIDER_SITE_OTHER): Payer: Medicare HMO | Admitting: Obstetrics and Gynecology

## 2021-06-18 VITALS — BP 100/60 | Wt 129.0 lb

## 2021-06-18 DIAGNOSIS — Z01419 Encounter for gynecological examination (general) (routine) without abnormal findings: Secondary | ICD-10-CM

## 2021-06-18 NOTE — Progress Notes (Signed)
Subjective:  ?  ? Melody Steele is a 66 y.o. female P2 with BMI 22, postmenopausal who is here for a comprehensive physical exam. The patient reports no problems. She denies any episodes of postmenopausal vaginal bleeding. She is not sexually active. She denies pelvic pain or abnormal discharge. She denies urinary incontinence. She reports regular bowel movements. Patient is without complaints ? ?Past Medical History:  ?Diagnosis Date  ? Allergic rhinitis   ? Arthritis   ? History of mammogram 2011; 08/29/13; 09/12/14  ? bibc neg; neg; neg  ? History of Papanicolaou smear of cervix 04/29/11; 08/29/13  ? -/-; -/-  ? Hypertension   ? Osteoarthritis   ? Osteopenia   ? -2.46 z score  ? ?Past Surgical History:  ?Procedure Laterality Date  ? APPENDECTOMY    ? AUGMENTATION MAMMAPLASTY Bilateral 1982  ? Taken out 1995  ? BILATERAL SALPINGOOPHORECTOMY  2003  ? COLONOSCOPY  07/2015  ? FOOT SURGERY  11/2011  ? HAND SURGERY    ? thumb  ? HYSTEROSCOPY  2003  ? [place]  ? [place] SURGERY  2006  ? SHOULDER ARTHROSCOPY  2008  ? SCRAPED BURSAL SAC  ? ?Family History  ?Problem Relation Age of Onset  ? Breast cancer Maternal Aunt 55  ?     older than 55  ? Heart disease Mother   ? Hypertension Mother   ? Stroke Mother   ? ? ?Social History  ? ?Socioeconomic History  ? Marital status: Single  ?  Spouse name: Not on file  ? Number of children: 2  ? Years of education: 85  ? Highest education level: Not on file  ?Occupational History  ? Occupation: [employer]  ?  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 Smoking status: Former  ? Smokeless tobacco: Never  ? Tobacco comments:  ?  QUIT DATE:2003  ?Vaping Use  ? Vaping Use: Never used  ?Substance and Sexual Activity  ? Alcohol use: No  ? Drug use: No  ? Sexual activity: Never  ?  Birth control/protection: Surgical  ?Other Topics Concern  ? Not on file  ?Social History Narrative  ? Not on file  ? ?Social Determinants of Health  ? ?Financial Resource Strain: Not on file  ?Food Insecurity: Not on file   ?Transportation Needs: Not on file  ?Physical Activity: Not on file  ?Stress: Not on file  ?Social Connections: Not on file  ?Intimate Partner Violence: Not on file  ? ?Health Maintenance  ?Topic Date Due  ? COVID-19 Vaccine (1) Never done  ? Hepatitis C Screening  Never done  ? TETANUS/TDAP  Never done  ? Zoster Vaccines- Shingrix (1 of 2) Never done  ? Pneumonia Vaccine 70+ Years old (1 - PCV) Never done  ? INFLUENZA VACCINE  Never done  ? MAMMOGRAM  05/03/2023  ? COLONOSCOPY (Pts 45-19yrs Insurance coverage will need to be confirmed)  03/31/2025  ? DEXA SCAN  Completed  ? HPV VACCINES  Aged Out  ? ? ?  ? ?Review of Systems ?Pertinent items noted in HPI and remainder of comprehensive ROS otherwise negative.  ? ?Objective:  ? ? GENERAL: Well-developed, well-nourished female in no acute distress.  ?HEENT: Normocephalic, atraumatic. Sclerae anicteric.  ?NECK: Supple. Normal thyroid.  ?LUNGS: Clear to auscultation bilaterally.  ?HEART: Regular rate and rhythm. ?BREASTS: Symmetric in size. No palpable masses or lymphadenopathy, skin changes, or nipple drainage. ?ABDOMEN: Soft, nontender, nondistended. No organomegaly. ?PELVIC: Normal external female genitalia. Vagina  is pale and atrophic.  Normal discharge. Vaginal vault intact. No adnexal mass or tenderness. Chaperone present during the pelvic exam ?EXTREMITIES: No cyanosis, clubbing, or edema, 2+ distal pulses. ?  ?  ?Assessment:  ? ? Healthy female exam.    ?  ?Plan:  ? ? Patient with mammogram 2/23 ?Patient had DEXA 10/2020 ?Colonoscopy not due until 2027 ?Pap smear no longer indicated particularly in the setting of a hysterectomy without a history of abnormal pap smear ?Patient to follow up as scheduled with PCP ?See After Visit Summary for Counseling Recommendations  ? ?

## 2021-08-19 ENCOUNTER — Encounter: Payer: Self-pay | Admitting: Obstetrics and Gynecology

## 2022-04-03 ENCOUNTER — Other Ambulatory Visit: Payer: Self-pay | Admitting: Internal Medicine

## 2022-04-03 DIAGNOSIS — Z1231 Encounter for screening mammogram for malignant neoplasm of breast: Secondary | ICD-10-CM

## 2022-05-06 ENCOUNTER — Ambulatory Visit
Admission: RE | Admit: 2022-05-06 | Discharge: 2022-05-06 | Disposition: A | Discharge status: Home / Self Care | Payer: Medicare HMO | Source: Ambulatory Visit | Attending: Internal Medicine | Admitting: Internal Medicine

## 2022-05-06 DIAGNOSIS — Z1231 Encounter for screening mammogram for malignant neoplasm of breast: Secondary | ICD-10-CM

## 2022-07-03 ENCOUNTER — Other Ambulatory Visit: Payer: Self-pay | Admitting: Internal Medicine

## 2022-07-03 ENCOUNTER — Ambulatory Visit
Admission: RE | Admit: 2022-07-03 | Discharge: 2022-07-03 | Disposition: A | Discharge status: Home / Self Care | Payer: Medicare HMO | Attending: Orthopedic Surgery | Admitting: Orthopedic Surgery

## 2022-07-03 ENCOUNTER — Ambulatory Visit
Admission: RE | Admit: 2022-07-03 | Discharge: 2022-07-03 | Disposition: A | Discharge status: Home / Self Care | Payer: Medicare HMO | Source: Ambulatory Visit | Attending: Internal Medicine | Admitting: Internal Medicine

## 2022-07-03 DIAGNOSIS — R059 Cough, unspecified: Secondary | ICD-10-CM

## 2022-07-17 ENCOUNTER — Ambulatory Visit: Admission: RE | Admit: 2022-07-17 | Payer: Medicare HMO | Source: Home / Self Care

## 2022-07-18 ENCOUNTER — Ambulatory Visit
Admission: RE | Admit: 2022-07-18 | Discharge: 2022-07-18 | Disposition: A | Discharge status: Home / Self Care | Payer: Medicare HMO | Source: Ambulatory Visit | Attending: Internal Medicine | Admitting: Internal Medicine

## 2022-07-18 ENCOUNTER — Other Ambulatory Visit: Payer: Self-pay | Admitting: Internal Medicine

## 2022-07-18 DIAGNOSIS — R918 Other nonspecific abnormal finding of lung field: Secondary | ICD-10-CM | POA: Diagnosis present

## 2022-07-31 NOTE — Progress Notes (Signed)
ANNUAL PREVENTATIVE CARE GYNECOLOGY  ENCOUNTER NOTE  Subjective:       Melody Steele is a 67 y.o. [phone] postmenopausal female here for a routine annual gynecologic exam. The patient is not sexually active. The patient is not taking hormone replacement therapy. Patient denies post-menopausal vaginal bleeding. The patient wears seatbelts: yes. The patient participates in regular exercise: yes. Has the patient ever been transfused or tattooed?: no. The patient reports that there is not domestic violence in her life.  Current complaints: 1. Does note persistent cough for the past several months. Had COVID in January. Is being worked up by PCP.     Gynecologic History No LMP recorded. Patient has had a hysterectomy. Contraception: status post hysterectomy, postmenopausal Last Pap: Pap smear no longer indicated particularly in the setting of a hysterectomy without a history of abnormal pap smear  Last mammogram: 05/06/2022. Results were: normal Last Colonoscopy: 04/01/2015: 10 years Last Dexa Scan: 11/07/2020: For repeat in 2 years (osteoporosis)   Obstetric History OB History  Gravida Para Term Preterm AB Living  2 2 1 1   2   SAB IAB Ectopic Multiple Live Births          2    # Outcome Date GA Lbr Len/2nd Weight Sex Delivery Anes PTL Lv  2 Term 10/13/78   6 lb 10 oz (3.005 kg) F    LIV  1 Preterm 09/27/71   3 lb 13.5 oz (1.744 kg) M Vag-Spont   LIV     Birth Comments: CEREBRAL PALSEY    Past Medical History:  Diagnosis Date   Allergic rhinitis    Arthritis    History of mammogram 2011; 08/29/13; 09/12/14   bibc neg; neg; neg   History of Papanicolaou smear of cervix 04/29/11; 08/29/13   -/-; -/-   Hypertension    Osteoarthritis    Osteopenia    -2.46 z score    Family History  Problem Relation Age of Onset   Heart disease Mother    Hypertension Mother    Stroke Mother    Pancreatic cancer Father    Breast cancer Maternal Aunt 21       older than 31    Past Surgical  History:  Procedure Laterality Date   APPENDECTOMY     AUGMENTATION MAMMAPLASTY Bilateral 1982   Taken out 1995   BILATERAL SALPINGOOPHORECTOMY  2003   COLONOSCOPY  07/2015   FOOT SURGERY  11/2011   HAND SURGERY     thumb   HYSTEROSCOPY  2003   [facility]   LUMBAR DISC SURGERY  2006   SHOULDER ARTHROSCOPY  2008   SCRAPED BURSAL [facility]AC    Social History   Socioeconomic History   Marital status: Single    Spouse name: Not on file   Number of children: 2   Years of education: 12   Highest education level: Not on file  Occupational History   Occupation: [employer]    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  Other Topics Concern   Not on file  Social History Narrative   Not on file   Social Determinants of Health   Financial Resource Strain: Not on  file  Food Insecurity: Not on file  Transportation Needs: Not on file  Physical Activity: Not on file  Stress: Not on file  Social Connections: Not on file  Intimate Partner Violence: Not on file    Current Outpatient Medications on File Prior to Visit  Medication Sig Dispense Refill   amLODipine (NORVASC) 2.5 MG tablet Take 2.5 mg by mouth daily.     atorvastatin (LIPITOR) 20 MG tablet      Bacillus Coagulans-Inulin (PROBIOTIC) 1-250 BILLION-MG CAPS Take 1 tablet by mouth daily.     Calcium Carbonate-Vitamin D 600-400 MG-UNIT tablet Take 1 tablet daily by mouth. (Patient not taking: Reported on 06/18/2021)     methylPREDNISolone (MEDROL) 4 MG tablet Take as directed 21 tablet 0   naproxen (NAPROSYN) 500 MG tablet TAKE 1 TABLET BY MOUTH TWO TIMES DAILY AFTER BREAKFAST AND DINNER 60 tablet 1   tiZANidine (ZANAFLEX) 4 MG capsule Take 4 mg by mouth at bedtime.     valACYclovir  (VALTREX) 500 MG tablet Take 500 mg as needed by mouth.  1   vitamin C (ASCORBIC ACID) 250 MG tablet Take 500 mg by mouth daily.     No current facility-administered medications on file prior to visit.    Allergies  Allergen Reactions   Codeine Nausea Only and Nausea And Vomiting     Review of Systems ROS Review of Systems - General ROS: negative for - chills, fatigue, fever, hot flashes, night sweats, weight gain or weight loss Psychological ROS: negative for - anxiety, decreased libido, depression, mood swings, physical abuse or sexual abuse Ophthalmic ROS: negative for - blurry vision, eye pain or loss of vision ENT ROS: negative for - headaches, hearing change, visual changes or vocal changes Allergy and Immunology ROS: negative for - hives, itchy/watery eyes or seasonal allergies Hematological and Lymphatic ROS: negative for - bleeding problems, bruising, swollen lymph nodes or weight loss Endocrine ROS: negative for - galactorrhea, hair pattern changes, hot flashes, malaise/lethargy, mood swings, palpitations, polydipsia/polyuria, skin changes, temperature intolerance or unexpected weight changes Breast ROS: negative for - new or changing breast lumps or nipple discharge Respiratory ROS: negative for - shortness of breath.  Positive for chronic cough.  Cardiovascular ROS: negative for - chest pain, irregular heartbeat, palpitations or shortness of breath Gastrointestinal ROS: no abdominal pain, change in bowel habits, or black or bloody stools Genito-Urinary ROS: no dysuria, trouble voiding, or hematuria Musculoskeletal ROS: negative for - joint pain or joint stiffness Neurological ROS: negative for - bowel and bladder control changes Dermatological ROS: negative for rash and skin lesion changes   Objective:   BP 131/82   Pulse 78   Resp 16   Ht 5' 3.5" (1.613 m)   Wt 131 lb 11.2 oz (59.7 kg)   BMI 22.96 kg/m  CONSTITUTIONAL: Well-developed, well-nourished female in no  acute distress.  PSYCHIATRIC: Normal mood and affect. Normal behavior. Normal judgment and thought content. NEUROLGIC: Alert and oriented to person, place, and time. Normal muscle tone coordination. No cranial nerve deficit noted. HENT:  Normocephalic, atraumatic, External right and left ear normal. Oropharynx is clear and moist EYES: Conjunctivae and EOM are normal. Pupils are equal, round, and reactive to light. No scleral icterus.  NECK: Normal range of motion, supple, no masses.  Normal thyroid.  SKIN: Skin is warm and dry. No rash noted. Not diaphoretic. No erythema. No pallor. CARDIOVASCULAR: Normal heart rate noted, regular rhythm, no murmur. RESPIRATORY: Clear to auscultation bilaterally. Effort and breath sounds normal,  no problems with respiration noted. BREASTS: Symmetric in size. No masses, skin changes, nipple drainage, or lymphadenopathy. ABDOMEN: Soft, normal bowel sounds, no distention noted.  No tenderness, rebound or guarding.  BLADDER: Normal PELVIC:  Bladder no bladder distension noted  Urethra: normal appearing urethra with no masses, tenderness or lesions  Vulva: normal appearing vulva with no masses, tenderness or lesions  Vagina: mildly atrophic vagina without discharge, no lesions  Cervix: normal appearing cervix without discharge or lesions  Uterus: surgically absent  Adnexa: surgically absent bilateral  RV: External Exam NormaI, No Rectal Masses, and Normal Sphincter tone  MUSCULOSKELETAL: Normal range of motion. No tenderness.  No cyanosis, clubbing, or edema.  2+ distal pulses. LYMPHATIC: No Axillary, Supraclavicular, or Inguinal Adenopathy.   Labs: Labs performed by PCP at outside clinic.    Assessment:   1. Well woman exam with routine gynecological exam   2. Postmenopausal   3. History of hysterectomy, supracervical      Plan:  Pap: Not needed.  Has aged out of screening.  Mammogram:  UTD .  Colon Screening:   UTD .  Dexa Screening: Patient will  be due for next screen in August of this month. Labs:  UTD by PCP Routine preventative health maintenance measures emphasized: Exercise/Diet/Weight control Mild vaginal atrophy, not bothersome to patient. No treatment indicated. Marland Kitchen  COVID Vaccination status: Return to Clinic - Every year or every other year (patient preference)   Hildred Laser, MD [facility]

## 2022-07-31 NOTE — Patient Instructions (Signed)
Preventive Care 65 Years and Older, Female Preventive care refers to lifestyle choices and visits with your health care provider that can promote health and wellness. Preventive care visits are also called wellness exams. What can I expect for my preventive care visit? Counseling Your health care provider may ask you questions about your: Medical history, including: Past medical problems. Family medical history. Pregnancy and menstrual history. History of falls. Current health, including: Memory and ability to understand (cognition). Emotional well-being. Home life and relationship well-being. Sexual activity and sexual health. Lifestyle, including: Alcohol, nicotine or tobacco, and drug use. Access to firearms. Diet, exercise, and sleep habits. Work and work environment. Sunscreen use. Safety issues such as seatbelt and bike helmet use. Physical exam Your health care provider will check your: Height and weight. These may be used to calculate your BMI (body mass index). BMI is a measurement that tells if you are at a healthy weight. Waist circumference. This measures the distance around your waistline. This measurement also tells if you are at a healthy weight and may help predict your risk of certain diseases, such as type 2 diabetes and high blood pressure. Heart rate and blood pressure. Body temperature. Skin for abnormal spots. What immunizations do I need?  Vaccines are usually given at various ages, according to a schedule. Your health care provider will recommend vaccines for you based on your age, medical history, and lifestyle or other factors, such as travel or where you work. What tests do I need? Screening Your health care provider may recommend screening tests for certain conditions. This may include: Lipid and cholesterol levels. Hepatitis C test. Hepatitis B test. HIV (human immunodeficiency virus) test. STI (sexually transmitted infection) testing, if you are at  risk. Lung cancer screening. Colorectal cancer screening. Diabetes screening. This is done by checking your blood sugar (glucose) after you have not eaten for a while (fasting). Mammogram. Talk with your health care provider about how often you should have regular mammograms. BRCA-related cancer screening. This may be done if you have a family history of breast, ovarian, tubal, or peritoneal cancers. Bone density scan. This is done to screen for osteoporosis. Talk with your health care provider about your test results, treatment options, and if necessary, the need for more tests. Follow these instructions at home: Eating and drinking  Eat a diet that includes fresh fruits and vegetables, whole grains, lean protein, and low-fat dairy products. Limit your intake of foods with high amounts of sugar, saturated fats, and salt. Take vitamin and mineral supplements as recommended by your health care provider. Do not drink alcohol if your health care provider tells you not to drink. If you drink alcohol: Limit how much you have to 0-1 drink a day. Know how much alcohol is in your drink. In the [place] one drink equals one 12 oz bottle of beer (355 mL), one 5 oz glass of wine (148 mL), or one 1 oz glass of hard liquor (44 mL). Lifestyle Brush your teeth every morning and night with fluoride toothpaste. Floss one time each day. Exercise for at least 30 minutes 5 or more days each week. Do not use any products that contain nicotine or tobacco. These products include cigarettes, chewing tobacco, and vaping devices, such as e-cigarettes. If you need help quitting, ask your health care provider. Do not use drugs. If you are sexually active, practice safe sex. Use a condom or other form of protection in order to prevent STIs. Take aspirin only as told by   your health care provider. Make sure that you understand how much to take and what form to take. Work with your health care provider to find out whether it  is safe and beneficial for you to take aspirin daily. Ask your health care provider if you need to take a cholesterol-lowering medicine (statin). Find healthy ways to manage stress, such as: Meditation, yoga, or listening to music. Journaling. Talking to a trusted person. Spending time with friends and family. Minimize exposure to UV radiation to reduce your risk of skin cancer. Safety Always wear your seat belt while driving or riding in a vehicle. Do not drive: If you have been drinking alcohol. Do not ride with someone who has been drinking. When you are tired or distracted. While texting. If you have been using any mind-altering substances or drugs. Wear a helmet and other protective equipment during sports activities. If you have firearms in your house, make sure you follow all gun safety procedures. What's next? Visit your health care provider once a year for an annual wellness visit. Ask your health care provider how often you should have your eyes and teeth checked. Stay up to date on all vaccines. This information is not intended to replace advice given to you by your health care provider. Make sure you discuss any questions you have with your health care provider. Document Revised: 09/12/2020 Document Reviewed: 09/12/2020 Elsevier Patient Education  2023 Else[employer] Inc[employer] Breast Self-Awareness Breast self-awareness is knowing how your breasts look and feel. You need to: Check your breasts on a regular basis. Tell your doctor about any changes. Become familiar with the look and feel of your breasts. This can help you catch a breast problem while it is still small and can be treated. You should do breast self-exams even if you have breast implants. What you need: A mirror. A well-lit room. A pillow or other soft object. How to do a breast self-exam Follow these steps to do a breast self-exam: Look for changes  Take off all the clothes above your waist. Stand in front of a  mirror in a room with good lighting. Put your hands down at your sides. Compare your breasts in the mirror. Look for any difference between them, such as: A difference in shape. A difference in size. Wrinkles, dips, and bumps in one breast and not the other. Look at each breast for changes in the skin, such as: Redness. Scaly areas. Skin that has gotten thicker. Dimpling. Open sores (ulcers). Look for changes in your nipples, such as: Fluid coming out of a nipple. Fluid around a nipple. Bleeding. Dimpling. Redness. A nipple that looks pushed in (retracted), or that has changed position. Feel for changes Lie on your back. Feel each breast. To do this: Pick a breast to feel. Place a pillow under the shoulder closest to that breast. Put the arm closest to that breast behind your head. Feel the nipple area of that breast using the hand of your other arm. Feel the area with the pads of your three middle fingers by making small circles with your fingers. Use light, medium, and firm pressure. Continue the overlapping circles, moving downward over the breast. Keep making circles with your fingers. Stop when you feel your ribs. Start making circles with your fingers again, this time going upward until you reach your collarbone. Then, make circles outward across your breast and into your armpit area. Squeeze your nipple. Check for discharge and lumps. Repeat these steps to check your other   breast. Sit or stand in the tub or shower. With soapy water on your skin, feel each breast the same way you did when you were lying down. Write down what you find Writing down what you find can help you remember what to tell your doctor. Write down: What is normal for each breast. Any changes you find in each breast. These include: The kind of changes you find. A tender or painful breast. Any lump you find. Write down its size and where it is. When you last had your monthly period (menstrual  cycle). General tips If you are breastfeeding, the best time to check your breasts is after you feed your baby or after you use a breast pump. If you get monthly bleeding, the best time to check your breasts is 5-7 days after your monthly cycle ends. With time, you will become comfortable with the self-exam. You will also start to know if there are changes in your breasts. Contact a doctor if: You see a change in the shape or size of your breasts or nipples. You see a change in the skin of your breast or nipples, such as red or scaly skin. You have fluid coming from your nipples that is not normal. You find a new lump or thick area. You have breast pain. You have any concerns about your breast health. Summary Breast self-awareness includes looking for changes in your breasts and feeling for changes within your breasts. You should do breast self-awareness in front of a mirror in a well-lit room. If you get monthly periods (menstrual cycles), the best time to check your breasts is 5-7 days after your period ends. Tell your doctor about any changes you see in your breasts. Changes include changes in size, changes on the skin, painful or tender breasts, or fluid from your nipples that is not normal. This information is not intended to replace advice given to you by your health care provider. Make sure you discuss any questions you have with your health care provider. Document Revised: 08/22/2021 Document Reviewed: 01/17/2021 Elsevier Patient Education  2023 [employer] Inc.  

## 2022-08-01 ENCOUNTER — Encounter: Payer: Self-pay | Admitting: Obstetrics and Gynecology

## 2022-08-01 ENCOUNTER — Ambulatory Visit (INDEPENDENT_AMBULATORY_CARE_PROVIDER_SITE_OTHER): Payer: Medicare HMO | Admitting: Obstetrics and Gynecology

## 2022-08-01 VITALS — BP 131/82 | HR 78 | Resp 16 | Ht 63.5 in | Wt 131.7 lb

## 2022-08-01 DIAGNOSIS — Z01419 Encounter for gynecological examination (general) (routine) without abnormal findings: Secondary | ICD-10-CM | POA: Diagnosis not present

## 2022-08-01 DIAGNOSIS — Z90711 Acquired absence of uterus with remaining cervical stump: Secondary | ICD-10-CM

## 2022-08-01 DIAGNOSIS — Z780 Asymptomatic menopausal state: Secondary | ICD-10-CM

## 2022-08-01 DIAGNOSIS — M810 Age-related osteoporosis without current pathological fracture: Secondary | ICD-10-CM

## 2022-09-11 ENCOUNTER — Ambulatory Visit
Admission: RE | Admit: 2022-09-11 | Discharge: 2022-09-11 | Disposition: A | Discharge status: Home / Self Care | Payer: Medicare HMO | Attending: Internal Medicine | Admitting: Internal Medicine

## 2022-09-11 ENCOUNTER — Ambulatory Visit
Admission: RE | Admit: 2022-09-11 | Discharge: 2022-09-11 | Disposition: A | Discharge status: Home / Self Care | Payer: Medicare HMO | Source: Ambulatory Visit | Attending: Internal Medicine | Admitting: Internal Medicine

## 2022-09-11 ENCOUNTER — Other Ambulatory Visit: Payer: Self-pay | Admitting: Internal Medicine

## 2022-09-11 DIAGNOSIS — R918 Other nonspecific abnormal finding of lung field: Secondary | ICD-10-CM

## 2022-11-11 ENCOUNTER — Other Ambulatory Visit: Payer: Medicare HMO

## 2022-12-25 ENCOUNTER — Ambulatory Visit
Admission: RE | Admit: 2022-12-25 | Discharge: 2022-12-25 | Disposition: A | Discharge status: Home / Self Care | Payer: Medicare HMO | Source: Ambulatory Visit | Attending: Obstetrics and Gynecology | Admitting: Obstetrics and Gynecology

## 2022-12-25 DIAGNOSIS — M810 Age-related osteoporosis without current pathological fracture: Secondary | ICD-10-CM | POA: Diagnosis present

## 2023-01-12 ENCOUNTER — Ambulatory Visit: Payer: Medicare HMO | Admitting: Physician Assistant

## 2023-01-28 ENCOUNTER — Ambulatory Visit: Payer: Medicare HMO | Admitting: Orthopaedic Surgery

## 2023-05-26 ENCOUNTER — Other Ambulatory Visit: Payer: Self-pay | Admitting: Internal Medicine

## 2023-05-26 DIAGNOSIS — Z1231 Encounter for screening mammogram for malignant neoplasm of breast: Secondary | ICD-10-CM

## 2023-05-27 ENCOUNTER — Ambulatory Visit
Admission: RE | Admit: 2023-05-27 | Discharge: 2023-05-27 | Disposition: A | Discharge status: Home / Self Care | Payer: Medicare HMO | Source: Ambulatory Visit | Attending: Internal Medicine | Admitting: Internal Medicine

## 2023-05-27 DIAGNOSIS — Z1231 Encounter for screening mammogram for malignant neoplasm of breast: Secondary | ICD-10-CM | POA: Diagnosis present

## 2023-09-29 ENCOUNTER — Ambulatory Visit: Admitting: Physical Therapy

## 2023-09-29 DIAGNOSIS — M5459 Other low back pain: Secondary | ICD-10-CM | POA: Diagnosis present

## 2023-09-29 DIAGNOSIS — M25551 Pain in right hip: Secondary | ICD-10-CM | POA: Diagnosis present

## 2023-09-29 DIAGNOSIS — R262 Difficulty in walking, not elsewhere classified: Secondary | ICD-10-CM | POA: Insufficient documentation

## 2023-09-29 DIAGNOSIS — R29898 Other symptoms and signs involving the musculoskeletal system: Secondary | ICD-10-CM | POA: Insufficient documentation

## 2023-09-29 NOTE — Therapy (Unsigned)
 OUTPATIENT PHYSICAL THERAPY LOWER EXTREMITY EVALUATION   Patient Name: Melody Steele MRN: [id number] DOB:11-Oct-1955, 68 y.o., female Today's Date: 09/29/2023  END OF SESSION:  PT End of Session - 09/29/23 0853     Visit Number 1    Number of Visits 16    Date for PT Re-Evaluation 11/24/23    PT Start Time 0851    PT Stop Time 0930    PT Time Calculation (min) 39 min    Activity Tolerance Patient tolerated treatment well;Patient limited by pain          Past Medical History:  Diagnosis Date   Allergic rhinitis    Arthritis    History of mammogram 2011; 08/29/13; 09/12/14   bibc neg; neg; neg   History of Papanicolaou smear of cervix 04/29/11; 08/29/13   -/-; -/-   Hypertension    Osteoarthritis    Osteopenia    -2.46 z score   Past Surgical History:  Procedure Laterality Date   APPENDECTOMY     AUGMENTATION MAMMAPLASTY Bilateral 1982   Taken out 1995   BILATERAL SALPINGOOPHORECTOMY  2003   COLONOSCOPY  07/2015   FOOT SURGERY  11/2011   HAND SURGERY     thumb   HYSTEROSCOPY  2003   [facility]   LUMBAR DISC SURGERY  2006   SHOULDER ARTHROSCOPY  2008   [facility]AL [facility]   Patient Active Problem List   Diagnosis Date Noted   Cervicalgia 01/06/2017   Acute pain of right shoulder 01/06/2017   Hypercholesterolemia 11/19/2016    PCP: Corlis Honor BROCKS, MD   REFERRING PROVIDER: Jud Simmonds Dabu, PA-C   REFERRING DIAG:  Diagnosis  M25.551 (ICD-10-CM) - Pain in right hip  M54.50 (ICD-10-CM) - Low back pain, unspecified    THERAPY DIAG:  No diagnosis found.  Rationale for Evaluation and Treatment: Rehabilitation  ONSET DATE: 3-4 weeks ago  SUBJECTIVE:   SUBJECTIVE STATEMENT: Pt report feel pain in the Hip, was seen at [facility] and given Prednisone and Baclofen for 5 days, then states that she was seen in [place], and was given cortisone shot in the hip and was provided pain meds for about 5 days. Reports that she received mild relief with injection and pain meds,  but since high dose pain meds have been remove, pain has returned.   PERTINENT HISTORY: From recent MD visit:  Melody Steele is a 68 y.o. female new patient to [facility] who presents to orthopaedic urgent care today with her daughter for orthopaedic evaluation of right hip and low back pain. Symptoms began when she felt the right lower back grab as she bent down 2 weeks ago. She experienced intermittent pain in the right low back and hip area off-and-on for about a week but symptoms significantly worsened 1 week ago to a point where symptoms have become constant and affecting her normal daily activities. She was seen at [facility] clinic, where she was diagnosed with trochanteric bursitis. She was given oral prednisone for 5 days and baclofen. Took both with no significant benefit. Pain is from the low back/posterior hip area and wraps around the lateral hip and down the anterolateral thigh. Symptoms are provoked with weightbearing activities and moving the hip, especially getting up from a seated position. She denies any paresthesias or focal weakness distally.  PAIN:  Are you having pain? Yes: NPRS scale: 6/10  Pain location: buttock and radiating into mid lateral thigh  Pain description: Ache occasional stabbing  Aggravating factors:  laying in bed, increased time in standing  Relieving factors: taken pressure off leg, Ice and heat to reduce pain   PRECAUTIONS: None  RED FLAGS: None   WEIGHT BEARING RESTRICTIONS: No  FALLS:  Has patient fallen in last 6 months? 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  DIAGNOSTIC FINDINGS:  Imaging:  X-rays were taken in clinic today and independently reviewed by me:  AP and lateral views of the right hip to include AP pelvis demonstrate mild degenerative changes in both hips with osteophyte formation, subchondral sclerosis. No acute osseous pathology.  Hx of DDD in the spine   PATIENT SURVEYS:  LEFS : 24/80: 30%    Extreme difficulty/unable (0), Quite a bit of difficulty (1), Moderate difficulty (2), Little difficulty (3), No difficulty (4) Survey date:    Any of your usual work, housework or school activities 1  2. Usual hobbies, recreational or sporting activities 1  3. Getting into/out of the bath 4  4. Walking between rooms 1  5. Putting on socks/shoes 4  6. Squatting  3  7. Lifting an object, like a bag of groceries from the floor 2  8. Performing light activities around your home 1  9. Performing heavy activities around your home 1  10. Getting into/out of a car 4  11. Walking 2 blocks 0  12. Walking 1 mile 0  13. Going up/down 10 stairs (1 flight) 0  14. Standing for 1 hour 0  15.  sitting for 1 hour 2  16. Running on even ground 0  17. Running on uneven ground 0  18. Making sharp turns while running fast 0  19. Hopping  0  20. Rolling over in bed 1   Score total:  24/80 (32%)     COGNITION: Overall cognitive status: Within functional limits for tasks assessed     SENSATION: WFL  EDEMA:  WFL  MUSCLE LENGTH: Hamstrings: Right 8 deg ; Left 10 deg Debby test: WFL  POSTURE: rounded shoulders and forward head  PALPATION: Multipl concordant trigger points in R side gluteals and piriformis   LOWER EXTREMITY ROM:  Active ROM Right eval Left eval  Hip flexion 4 4+  Hip extension 4 4+  Hip abduction 4- 4+  Hip adduction 5 5  Hip internal rotation    Hip external rotation    Knee flexion 4+ 4+  Knee extension 5 5  Ankle dorsiflexion 5 5  Ankle plantarflexion    Ankle inversion    Ankle  eversion     (Blank rows = not tested)  LOWER EXTREMITY MMT:  MMT Right eval Left eval  Hip flexion [facility]  Hip extension [facility]  Hip abduction [facility]  Hip adduction Lacking 5% WFL  Hip internal rotation [facility] Lacking 5 deg   Hip external rotation 15 deg 20 deg   Knee flexion [facility]  Knee extension [facility]  Ankle dorsiflexion [facility] WFL  Ankle plantarflexion    Ankle inversion    Ankle eversion     (Blank rows = not tested)  LOWER EXTREMITY SPECIAL TESTS:  Hip special tests: Belvie (  FABER) test: positive , Trendelenburg test: negative, Thomas test: negative, SI compression test: negative, SI distraction test: negative, and Hip scouring test: negative  FUNCTIONAL TESTS:   Berg Balance Scale: to be completed.  6 min walk test: to be completed   GAIT: Distance walked: 60 Assistive device utilized: None Level of assistance: Complete Independence Comments: mild antalgia on the R with decreased pelvic rotation                                                                                                                                 TREATMENT DATE: 09/29/2023  PT Evaluation HEP provided as listed below   PATIENT EDUCATION:  Education details: POC. HEP. Referred pain from hip to thigh.  Person educated: Patient Education method: Medical illustrator Education comprehension: verbalized understanding and returned demonstration  HOME EXERCISE PROGRAM: Access Code: [facility] URL: [email or web address] Date: 09/29/2023 Prepared by: Massie Dollar  Exercises - Supine Lower Trunk Rotation  - 1 x daily - 7 x weekly - 3 sets - 10 reps - 5 hold - Supine Single Knee to Double Knee to Chest Stretch  - 1 x daily - 7 x weekly - 3 sets - 4 reps - 20 sec  hold - Clamshell  - 1 x daily - 7 x weekly - 3 sets - 10 reps - 3 hold - Seated Piriformis Stretch  - 1 x daily - 7 x weekly - 3 sets - 4 reps - 20 sec  hold  ASSESSMENT:  CLINICAL IMPRESSION: Patient is  a 68 y.o. female who was seen today for physical therapy evaluation and treatment for R hip pain. Pt reports that pain radiates from    OBJECTIVE IMPAIRMENTS: decreased balance, decreased endurance, decreased knowledge of use of DME, decreased mobility, difficulty walking, decreased ROM, decreased strength, increased fascial restrictions, increased muscle spasms, impaired flexibility, improper body mechanics, postural dysfunction, and pain.   ACTIVITY LIMITATIONS: carrying, lifting, bending, sitting, standing, squatting, sleeping, stairs, transfers, bed mobility, locomotion level, and caring for others  PARTICIPATION LIMITATIONS: meal prep, cleaning, laundry, driving, shopping, community activity, occupation, and yard work  PERSONAL FACTORS: 1 comorbidity: Arthritis  are also affecting patient's functional outcome.   REHAB POTENTIAL: Excellent  CLINICAL DECISION MAKING: Stable/uncomplicated  EVALUATION COMPLEXITY: Low   GOALS: Goals reviewed with patient? Yes  SHORT TERM GOALS: Target date: 11/03/2023     Patient will be independent in home exercise program to improve strength/mobility for better functional independence with ADLs. Baseline: provided on 7/1 Goal status: INITIAL   LONG TERM GOALS: Target date: 11/24/2023    Patient will increase LEFS score by equal to or greater than  15%   to demonstrate statistically significant improvement in mobility and quality of life.  Baseline: 32% Goal status: INITIAL   2.  Patient will increase Berg Balance score by > 6 points to demonstrate decreased fall risk during functional activities Baseline: to be completed  Goal status: INITIAL  4.  Patient will increase 6 min walk test by at least 151ft as to improve gait speed for better community ambulation and to reduce fall risk. Baseline: to be completed  Goal status: INITIAL  5.  Patient will reduce pain to 2/10 at worst with household activities to improve independence and safety  with management of adult son's needs  Baseline: 6/10 current 8/10 worst Goal status: INITIAL    PLAN:  PT FREQUENCY: 1-2x/week  PT DURATION: 8 weeks  PLANNED INTERVENTIONS: 97164- PT Re-evaluation, 97750- Physical Performance Testing, 97110-Therapeutic exercises, 97530- Therapeutic activity, 97112- Neuromuscular re-education, 97535- Self Care, 02859- Manual therapy, [id number]- Gait training, [phone]- Aquatic Therapy, [phone]- Electrical stimulation (unattended), [id number]- Electrical stimulation (manual), [id number]- Vasopneumatic device, [id number]- Ultrasound, [id number]- Traction (mechanical), [id number]- Ionotophoresis 4mg /ml Dexamethasone, 79439 (1-2 muscles), 20561 (3+ muscles)- Dry Needling, Patient/Family education, Balance training, Stair training, Taping, Joint mobilization, Joint manipulation, Spinal manipulation, Spinal mobilization, DME instructions, Cryotherapy, Moist heat, and Biofeedback  PLAN FOR NEXT SESSION:  Berg. 6 min walk test.  HEp review.  Manual for TP release with education for referred pain    Massie FORBES Dollar, PT 09/29/2023, 8:55 AM

## 2023-10-05 ENCOUNTER — Ambulatory Visit: Admitting: Physical Therapy

## 2023-10-05 DIAGNOSIS — M25551 Pain in right hip: Secondary | ICD-10-CM | POA: Diagnosis not present

## 2023-10-05 DIAGNOSIS — R29898 Other symptoms and signs involving the musculoskeletal system: Secondary | ICD-10-CM

## 2023-10-05 DIAGNOSIS — M5459 Other low back pain: Secondary | ICD-10-CM

## 2023-10-05 DIAGNOSIS — R262 Difficulty in walking, not elsewhere classified: Secondary | ICD-10-CM

## 2023-10-05 NOTE — Therapy (Signed)
 OUTPATIENT PHYSICAL THERAPY LOWER EXTREMITY Treatment.    Patient Name: Melody Steele MRN: [id number] DOB:January 01, 1956, 68 y.o., female Today's Date: 10/05/2023  END OF SESSION:  PT End of Session - 10/05/23 1535     Visit Number 2    Number of Visits 16    Date for PT Re-Evaluation 11/24/23    PT Start Time 1535    PT Stop Time 1615    PT Time Calculation (min) 40 min    Activity Tolerance Patient tolerated treatment well;Patient limited by pain          Past Medical History:  Diagnosis Date   Allergic rhinitis    Arthritis    History of mammogram 2011; 08/29/13; 09/12/14   bibc neg; neg; neg   History of Papanicolaou smear of cervix 04/29/11; 08/29/13   -/-; -/-   Hypertension    Osteoarthritis    Osteopenia    -2.46 z score   Past Surgical History:  Procedure Laterality Date   APPENDECTOMY     AUGMENTATION MAMMAPLASTY Bilateral 1982   Taken out 1995   BILATERAL SALPINGOOPHORECTOMY  2003   COLONOSCOPY  07/2015   FOOT SURGERY  11/2011   HAND SURGERY     thumb   HY[facility]COPY  2003   [facility]   LUMBAR DISC SURGERY  2006   SHOULDER ARTHROSCOPY  2008   SCRAPED BURSAL [facility]   Patient Active Problem List   Diagnosis Date Noted   Cervicalgia 01/06/2017   Acute pain of right shoulder 01/06/2017   Hypercholesterolemia 11/19/2016    PCP: Melody Honor BROCKS, MD   REFERRING PROVIDER: Jud Simmonds Dabu, PA-C   REFERRING DIAG:  Diagnosis  M25.551 (ICD-10-CM) - Pain in right hip  M54.50 (ICD-10-CM) - Low back pain, unspecified    THERAPY DIAG:  Pain of right hip joint  Difficulty in walking, not elsewhere classified  Weakness of right leg  Other low back pain  Rationale for Evaluation and Treatment: Rehabilitation  ONSET DATE: 3-4 weeks ago  SUBJECTIVE:   SUBJECTIVE STATEMENT:  10/05/2023: States that she has very little pain Friday and Saturday. But Sunday and today, they pain has returned. 2/10 at start of session. Used pain patch overnight without much relief.    From Eval:  Pt report feel pain in the Hip, was seen at [facility] and given Prednisone and Baclofen for 5 days, then states that she was seen in [place], and was given cortisone shot in the hip and was provided pain meds for about 5 days. Reports that she received mild relief with injection and pain meds, but since high dose pain meds have been remove, pain has returned.   PERTINENT HISTORY: From recent MD visit:  Melody Steele is a 68 y.o. female new patient to [facility] who presents to orthopaedic urgent care today with her daughter for orthopaedic evaluation of right hip and low back pain. Symptoms began when she felt the right lower back grab as she bent down 2 weeks ago. She experienced intermittent pain in the right low back and hip area off-and-on for about a week but symptoms significantly worsened 1 week ago to a point where symptoms have become constant and affecting her normal daily activities. She was seen at [facility] clinic, where she was diagnosed with trochanteric bursitis. She was given oral prednisone for 5 days and baclofen. Took both with no significant benefit. Pain is from the low back/posterior hip area and wraps around the lateral hip and down the  anterolateral thigh. Symptoms are provoked with weightbearing activities and moving the hip, especially getting up from a seated position. She denies any paresthesias or focal weakness distally.  PAIN:  Are you having pain? Yes: NPRS scale: 6/10  Pain location: buttock and radiating into mid lateral thigh  Pain description: Ache occasional stabbing  Aggravating factors: laying in bed, increased time in standing  Relieving factors: taken pressure off leg, Ice and heat to reduce pain   PRECAUTIONS: None  RED FLAGS: None   WEIGHT BEARING RESTRICTIONS: No  FALLS:  Has patient fallen in last 6 months? 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  DIAGNOSTIC FINDINGS:  Imaging:  X-rays were taken in clinic today and independently reviewed by me:  AP and lateral views of the right hip to include AP pelvis demonstrate mild degenerative changes in both hips with osteophyte formation, subchondral sclerosis. No acute osseous pathology.  Hx of DDD in the spine   PATIENT SURVEYS:  LEFS : 24/80: 30%    Extreme difficulty/unable (0), Quite a bit of difficulty (1), Moderate difficulty (2), Little difficulty (3), No difficulty (4) Survey date:    Any of your usual work, housework or school activities 1  2. Usual hobbies, recreational or sporting activities 1  3. Getting into/out of the bath 4  4. Walking between rooms 1  5. Putting on socks/shoes 4  6. Squatting  3  7. Lifting an object, like a bag of groceries from the floor 2  8. Performing light activities around your home 1  9. Performing heavy activities around your home 1  10. Getting into/out of a car 4  11. Walking 2 blocks 0  12. Walking 1 mile 0  13. Going up/down 10 stairs (1 flight) 0  14. Standing for 1 hour 0  15.  sitting for 1 hour 2  16. Running on even ground 0  17. Running on uneven ground 0  18. Making sharp turns while running fast 0  19. Hopping  0  20. Rolling over in bed 1   Score total:  24/80 (32%)     COGNITION: Overall cognitive status: Within functional limits for tasks assessed     SENSATION: WFL  EDEMA:  WFL  MUSCLE LENGTH: Hamstrings: Right 8 deg ; Left 10 deg Debby test: WFL  POSTURE: rounded shoulders and forward head  PALPATION: Multipl concordant trigger points in R side gluteals and piriformis   LOWER  EXTREMITY ROM:  Active ROM Right eval Left eval  Hip flexion 4 4+  Hip extension 4 4+  Hip abduction 4- 4+  Hip adduction 5 5  Hip internal rotation    Hip external rotation    Knee flexion 4+ 4+  Knee extension 5 5  Ankle dorsiflexion 5 5  Ankle plantarflexion    Ankle inversion    Ankle eversion     (Blank rows = not tested)  LOWER EXTREMITY MMT:  MMT Right eval Left eval  Hip flexion [facility]  Hip extension [facility]  Hip abduction [facility] WFL  Hip adduction Lacking 5% WFL  Hip internal rotation [facility] Lacking 5 deg  Hip external rotation 15 deg 20 deg   Knee flexion [facility]  Knee extension [facility]  Ankle dorsiflexion [facility] WFL  Ankle plantarflexion    Ankle inversion    Ankle eversion     (Blank rows = not tested)  LOWER EXTREMITY SPECIAL TESTS:  Hip special tests: Belvie (FABER) test: positive , Trendelenburg test: negative, Thomas test: negative, SI compression test: negative, SI distraction test: negative, and Hip scouring test: negative  FUNCTIONAL TESTS:   Berg Balance Scale: to be completed.  6 min walk test: to be completed   GAIT: Distance walked: 60 Assistive device utilized: None Level of assistance: Complete Independence Comments: mild antalgia on the R with decreased pelvic rotation                                                                                                                                 TREATMENT DATE: 10/05/2023   TE: LTR 3-5 sec hold x 10 bil  Hip adduction isometric with 3 sec hold. X 12  Bridge with adduction x 10 and 3 sec hold. X 10  Pelvic rock x 6 each tactile cues for improved body awareness and proper ROM through Lumbar spine.   Qped:  Cat cow x 8 each  UE reach x 6 bil  Donkey kick. X 5 bil  Cues for improved core activation    Manual:  STM with TrP release in glute med/max and piriformis x 6 min total STM to lumbar and lower Tspine with cross frictional massage. X 5 min total Grade 2 PA mobilizations to  Lumbar and lower Tspine 20 sec per segment     Pt reported centralization of pain to the proximal glutes at end of session.    PATIENT EDUCATION:  Education details: POC. HEP. Referred pain from hip to thigh.  Person educated: Patient Education method: Medical illustrator Education comprehension: verbalized understanding and returned demonstration  HOME EXERCISE PROGRAM: Access Code: [facility] URL: [email or web address] Date: 09/29/2023 Prepared by: Massie Dollar  Exercises - Supine Lower Trunk Rotation  - 1 x daily - 7 x weekly - 3 sets - 10 reps - 5 hold - Supine Single Knee to Double Knee to Chest Stretch  - 1 x daily - 7 x weekly - 3 sets - 4 reps - 20 sec  hold - Clamshell  - 1 x daily - 7 x weekly - 3 sets - 10 reps - 3 hold - Seated Piriformis Stretch  - 1 x daily - 7 x weekly - 3 sets - 4 reps - 20 sec  hold  ASSESSMENT:  CLINICAL IMPRESSION: Patient is a 68 y.o. female who was seen today for physical therapy evaluation and treatment for R hip pain. PT treatment focuses on improved core activation and hip strengthening. As well as pain management with STM to relieve radicular pain in the lateral and anterior R thigh. Pt reports pain centralized to proximal glutes upon  completion. Will need to expand core stability HEP at later date. As well as complete functional assessments including 6 min walk test. Pt will benefit from skilled PT to address strength and pain deficits to improve overall QoL and allow full return to PLOF.   OBJECTIVE IMPAIRMENTS: decreased balance, decreased endurance, decreased knowledge of use of DME, decreased mobility, difficulty walking, decreased ROM, decreased strength, increased fascial restrictions, increased muscle spasms, impaired flexibility, improper body mechanics, postural dysfunction, and pain.   ACTIVITY LIMITATIONS: carrying, lifting, bending, sitting, standing, squatting, sleeping, stairs, transfers, bed mobility,  locomotion level, and caring for others  PARTICIPATION LIMITATIONS: meal prep, cleaning, laundry, driving, shopping, community activity, occupation, and yard work  PERSONAL FACTORS: 1 comorbidity: Arthritis  are also affecting patient's functional outcome.   REHAB POTENTIAL: Excellent  CLINICAL DECISION MAKING: Stable/uncomplicated  EVALUATION COMPLEXITY: Low   GOALS: Goals reviewed with patient? Yes  SHORT TERM GOALS: Target date: 11/03/2023     Patient will be independent in home exercise program to improve strength/mobility for better functional independence with ADLs. Baseline: provided on 7/1 Goal status: INITIAL   LONG TERM GOALS: Target date: 11/24/2023    Patient will increase LEFS score by equal to or greater than  15%   to demonstrate statistically significant improvement in mobility and quality of life.  Baseline: 32% Goal status: INITIAL   2.  Patient will increase Berg Balance score by > 6 points to demonstrate decreased fall risk during functional activities Baseline: to be completed  Goal status: INITIAL  4.  Patient will increase 6 min walk test by at least 135ft as to improve gait speed for better community ambulation and to reduce fall risk. Baseline: to be completed  Goal status: INITIAL  5.  Patient will reduce pain to 2/10 at worst with household activities to improve independence and safety with management of adult son's needs  Baseline: 6/10 current 8/10 worst Goal status: INITIAL    PLAN:  PT FREQUENCY: 1-2x/week  PT DURATION: 8 weeks  PLANNED INTERVENTIONS: 97164- PT Re-evaluation, 97750- Physical Performance Testing, 97110-Therapeutic exercises, 97530- Therapeutic activity, 97112- Neuromuscular re-education, 97535- Self Care, 02859- Manual therapy, [id number]- Gait training, [id number]- Aquatic Therapy, [phone]- Electrical stimulation (unattended), [id number]- Electrical stimulation (manual), [id number]- Vasopneumatic device, [id number]- Ultrasound, [id number]- Traction  (mechanical), [id number]- Ionotophoresis 4mg /ml Dexamethasone, 79439 (1-2 muscles), 20561 (3+ muscles)- Dry Needling, Patient/Family education, Balance training, Stair training, Taping, Joint mobilization, Joint manipulation, Spinal manipulation, Spinal mobilization, DME instructions, Cryotherapy, Moist heat, and Biofeedback  PLAN FOR NEXT SESSION:  Complete Berg. 6 min walk test.  HEp review/expansion.  Manual for TP release in glutes and paraspinals.    Massie FORBES Dollar, PT 10/05/2023, 3:39 PM

## 2023-10-08 ENCOUNTER — Ambulatory Visit

## 2023-10-08 DIAGNOSIS — M5459 Other low back pain: Secondary | ICD-10-CM

## 2023-10-08 DIAGNOSIS — R262 Difficulty in walking, not elsewhere classified: Secondary | ICD-10-CM

## 2023-10-08 DIAGNOSIS — R29898 Other symptoms and signs involving the musculoskeletal system: Secondary | ICD-10-CM

## 2023-10-08 DIAGNOSIS — M25551 Pain in right hip: Secondary | ICD-10-CM | POA: Diagnosis not present

## 2023-10-08 NOTE — Therapy (Signed)
 OUTPATIENT PHYSICAL THERAPY LOWER EXTREMITY Treatment.    Patient Name: Melody Steele MRN: [id number] DOB:12/25/1955, 68 y.o., female Today's Date: 10/08/2023  END OF SESSION:  PT End of Session - 10/08/23 1150     Visit Number 3    Number of Visits 16    Date for PT Re-Evaluation 11/24/23    Progress Note Due on Visit 10    PT Start Time 1147    PT Stop Time 1230    PT Time Calculation (min) 43 min    Activity Tolerance Patient tolerated treatment well;Patient limited by pain          Past Medical History:  Diagnosis Date   Allergic rhinitis    Arthritis    History of mammogram 2011; 08/29/13; 09/12/14   bibc neg; neg; neg   History of Papanicolaou smear of cervix 04/29/11; 08/29/13   -/-; -/-   Hypertension    Osteoarthritis    Osteopenia    -2.46 z score   Past Surgical History:  Procedure Laterality Date   APPENDECTOMY     AUGMENTATION MAMMAPLASTY Bilateral 1982   Taken out 1995   BILATERAL SALPINGOOPHORECTOMY  2003   COLONOSCOPY  07/2015   FOOT SURGERY  11/2011   HAND SURGERY     thumb   [facility]  2003   [facility]   LUMBAR DISC SURGERY  2006   SHOULDER ARTHROSCOPY  2008   SCRAPED BURSAL [facility]   Patient Active Problem List   Diagnosis Date Noted   Cervicalgia 01/06/2017   Acute pain of right shoulder 01/06/2017   Hypercholesterolemia 11/19/2016    PCP: Melody Honor BROCKS, MD   REFERRING PROVIDER: Jud Simmonds Dabu, PA-C   REFERRING DIAG:  Diagnosis  M25.551 (ICD-10-CM) - Pain in right hip  M54.50 (ICD-10-CM) - Low back pain, unspecified    THERAPY DIAG:  Pain of right hip joint  Difficulty in walking, not elsewhere classified  Weakness of right leg  Other low back pain  Rationale for Evaluation and Treatment: Rehabilitation  ONSET DATE: 3-4 weeks ago  SUBJECTIVE:   SUBJECTIVE STATEMENT:  10/08/2023: Patient reports increased pain since Tues- rates now at 9/10 R SIJ pain and some right leg pain.  States felt okay leaving PT that day but  pain intensified later that same day.   From Eval:  Pt report feel pain in the Hip, was seen at [facility] and given Prednisone and Baclofen for 5 days, then states that she was seen in [place], and was given cortisone shot in the hip and was provided pain meds for about 5 days. Reports that she received mild relief with injection and pain meds, but since high dose pain meds have been remove, pain has returned.   PERTINENT HISTORY: From recent MD visit:  Melody Steele is a 68 y.o. female new patient to [facility] who presents to orthopaedic urgent care today with her daughter for orthopaedic evaluation of right hip and low back pain. Symptoms began when she felt the right lower back grab as she bent down 2 weeks ago. She experienced intermittent pain in the right low back and hip area off-and-on for about a week but symptoms significantly worsened 1 week ago to a point where symptoms have become constant and affecting her normal daily activities. She was seen at [facility] clinic, where she was diagnosed with trochanteric bursitis. She was given oral prednisone for 5 days and baclofen. Took both with no significant benefit. Pain is from the low  back/posterior hip area and wraps around the lateral hip and down the anterolateral thigh. Symptoms are provoked with weightbearing activities and moving the hip, especially getting up from a seated position. She denies any paresthesias or focal weakness distally.  PAIN:  Are you having pain? Yes: NPRS scale: 6/10  Pain location: buttock and radiating into mid lateral thigh  Pain description: Ache occasional stabbing  Aggravating factors: laying in bed, increased time in standing  Relieving factors: taken pressure off leg, Ice and heat to reduce pain   PRECAUTIONS: None  RED FLAGS: None   WEIGHT BEARING RESTRICTIONS: No  FALLS:  Has patient fallen in last 6 months? 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  DIAGNOSTIC FINDINGS:  Imaging:  X-rays were taken in clinic today and independently reviewed by me:  AP and lateral views of the right hip to include AP pelvis demonstrate mild degenerative changes in both hips with osteophyte formation, subchondral sclerosis. No acute osseous pathology.  Hx of DDD in the spine   PATIENT SURVEYS:  LEFS : 24/80: 30%    Extreme difficulty/unable (0), Quite a bit of difficulty (1), Moderate difficulty (2), Little difficulty (3), No difficulty (4) Survey date:    Any of your usual work, housework or school activities 1  2. Usual hobbies, recreational or sporting activities 1  3. Getting into/out of the bath 4  4. Walking between rooms 1  5. Putting on socks/shoes 4  6. Squatting  3  7. Lifting an object, like a bag of groceries from the floor 2  8. Performing light activities around your home 1  9. Performing heavy activities around your home 1  10. Getting into/out of a car 4  11. Walking 2 blocks 0  12. Walking 1 mile 0  13. Going up/down 10 stairs (1 flight) 0  14. Standing for 1 hour 0  15.  sitting for 1 hour 2  16. Running on even ground 0  17. Running on uneven ground 0  18. Making sharp turns while running fast 0  19. Hopping  0  20. Rolling over in bed 1   Score total:  24/80 (32%)     COGNITION: Overall cognitive status: Within functional limits for tasks assessed     SENSATION: WFL  EDEMA:  WFL  MUSCLE LENGTH: Hamstrings: Right 8 deg ; Left 10 deg Debby test: WFL  POSTURE: rounded shoulders and forward head  PALPATION: Multipl concordant trigger points in R side  gluteals and piriformis   LOWER EXTREMITY ROM:  Active ROM Right eval Left eval  Hip flexion 4 4+  Hip extension 4 4+  Hip abduction 4- 4+  Hip adduction 5 5  Hip internal rotation    Hip external rotation    Knee flexion 4+ 4+  Knee extension 5 5  Ankle dorsiflexion 5 5  Ankle plantarflexion    Ankle inversion    Ankle eversion     (Blank rows = not tested)  LOWER EXTREMITY MMT:  MMT Right eval Left eval  Hip flexion [facility]  Hip extension [facility]  Hip abduction [facility]  Hip  adduction Lacking 5% WFL  Hip internal rotation [facility] Lacking 5 deg   Hip external rotation 15 deg 20 deg   Knee flexion [facility] WFL  Knee extension [facility]  Ankle dorsiflexion [facility] WFL  Ankle plantarflexion    Ankle inversion    Ankle eversion     (Blank rows = not tested)  LOWER EXTREMITY SPECIAL TESTS:  Hip special tests: Belvie (FABER) test: positive , Trendelenburg test: negative, Thomas test: negative, SI compression test: negative, SI distraction test: negative, and Hip scouring test: negative  FUNCTIONAL TESTS:   Berg Balance Scale: to be completed.  6 min walk test: to be completed   GAIT: Distance walked: 60 Assistive device utilized: None Level of assistance: Complete Independence Comments: mild antalgia on the R with decreased pelvic rotation                                                                                                                                 TREATMENT DATE: 10/08/2023   Quick reassessment since patient reporting increased overall pain Location- palpated and most tender along R PSIS/SIJ region- very tender to touch- pain worse with walking- unable to complete 6 min walk test. (+) pain with hip mobility- flex/ext/abd/ER/IR- unable to lay flat on table- Some relief with long axis distraction with RLE. No obvious abnormalities with pelvis alignment- IC equal height- no obvious LLD or rotated pelvis.   6 Min Walk Test:  Instructed patient to ambulate as  quickly and as safely as possible for 6 minutes using LRAD. Patient was allowed to take standing rest breaks without stopping the test, but if the patient required a sitting rest break the clock would be stopped and the test would be over.  Results: 320 feet in 3 min- Stopped secondary to R sided low back pain (97.5 meters, Avg speed 0.39m/s) using no device with CGA. Results indicate that the patient has reduced endurance with ambulation compared to age matched norms.  Age Matched Norms: 52-69 yo M: 67 F: 46, 24-79 yo M: 2 F: 471, 77-89 yo M: 417 F: 392 MDC: 58.21 meters (190.98 feet) or 50 meters (ANPTA Core Set of Outcome Measures for Adults with Neurologic Conditions, 2018)   Manual: STM to R SI joint region x 5 min Long axis distraction x 45 sec x 2 (Patient reported some relief)  AP mobs R SI joint (supine/hooklye position) x 30 bouts x 2 Sidelye Lumbosacral Mobilization - grade IV = no cavitation and stopped due to increased overall discomfort in position.   TE LTR 3-5 sec hold x 10 bil  Figure 4 stretching - hold 30 sec x 2 each Side   Pt reported decreased pain from 9/10 initially down to 4/10 then as she walking out reported pain was back up to 6/10.    PATIENT EDUCATION:  Education details: POC. HEP. Referred pain from hip to thigh.  Person educated: Patient Education method: Medical illustrator  Education comprehension: verbalized understanding and returned demonstration  HOME EXERCISE PROGRAM: Access Code: [facility] URL: [email or web address] Date: 09/29/2023 Prepared by: Massie Dollar  Exercises - Supine Lower Trunk Rotation  - 1 x daily - 7 x weekly - 3 sets - 10 reps - 5 hold - Supine Single Knee to Double Knee to Chest Stretch  - 1 x daily - 7 x weekly - 3 sets - 4 reps - 20 sec  hold - Clamshell  - 1 x daily - 7 x weekly - 3 sets - 10 reps - 3 hold - Seated Piriformis Stretch  - 1 x daily - 7 x weekly - 3 sets - 4 reps - 20 sec   hold  ASSESSMENT:  CLINICAL IMPRESSION: Patient is a 68 y.o. female who was seen today for physical therapy treatment for R hip pain. Patient was reassessed due to report of increased overall pain since last visit. She presents with pain limited mobility - specifically along Right sided SI joint region and some anteriolateral right side thigh pain.  She was was pain limited and unable to complete 6 min walk test today with increased pain with walking. She could not get in a comfortable position today and was very guarded. She did respond favorably to some STM and light stretching. Reported some relief after session (from 9/10 down to 4/10 but after approx 75 feet of walking out of clinic- reported pain increased back to 6/10). Patient has f/u with MD next week and depending on how she is feeling may cancel next visit if not feeling better until she is seen by MD.  Pt will benefit from skilled PT to address strength and pain deficits to improve overall QoL and allow full return to PLOF.   OBJECTIVE IMPAIRMENTS: decreased balance, decreased endurance, decreased knowledge of use of DME, decreased mobility, difficulty walking, decreased ROM, decreased strength, increased fascial restrictions, increased muscle spasms, impaired flexibility, improper body mechanics, postural dysfunction, and pain.   ACTIVITY LIMITATIONS: carrying, lifting, bending, sitting, standing, squatting, sleeping, stairs, transfers, bed mobility, locomotion level, and caring for others  PARTICIPATION LIMITATIONS: meal prep, cleaning, laundry, driving, shopping, community activity, occupation, and yard work  PERSONAL FACTORS: 1 comorbidity: Arthritis  are also affecting patient's functional outcome.   REHAB POTENTIAL: Excellent  CLINICAL DECISION MAKING: Stable/uncomplicated  EVALUATION COMPLEXITY: Low   GOALS: Goals reviewed with patient? Yes  SHORT TERM GOALS: Target date: 11/03/2023     Patient will be independent in  home exercise program to improve strength/mobility for better functional independence with ADLs. Baseline: provided on 7/1 Goal status: INITIAL   LONG TERM GOALS: Target date: 11/24/2023    Patient will increase LEFS score by equal to or greater than  15%   to demonstrate statistically significant improvement in mobility and quality of life.  Baseline: 32% Goal status: INITIAL   2.  Patient will increase Berg Balance score by > 6 points to demonstrate decreased fall risk during functional activities Baseline: to be completed  Goal status: INITIAL  4.  Patient will increase 6 min walk test by at least 190ft as to improve gait speed for better community ambulation and to reduce fall risk. Baseline: to be completed; 10/08/2023= 320 feet in 3 min- stopped secondary to pain.  Goal status: INITIAL  5.  Patient will reduce pain to 2/10 at worst with household activities to improve independence and safety with management of adult son's needs  Baseline: 6/10 current 8/10 worst Goal status: INITIAL    PLAN:  PT FREQUENCY: 1-2x/week  PT DURATION: 8 weeks  PLANNED INTERVENTIONS: 97164- PT Re-evaluation, 97750- Physical Performance Testing, 97110-Therapeutic exercises, 97530- Therapeutic activity, [id number]- Neuromuscular re-education, 97535- Self Care, 02859- Manual therapy, [phone]- Gait training, [phone]- Aquatic Therapy, [phone]- Electrical stimulation (unattended), [phone]- Electrical stimulation (manual), [id number]- Vasopneumatic device, [id number]- Ultrasound, [id number]- Traction (mechanical), [id number]- Ionotophoresis 4mg /ml Dexamethasone, 20560 (1-2 muscles), [id number] (3+ muscles)- Dry Needling, Patient/Family education, Balance training, Stair training, Taping, Joint mobilization, Joint manipulation, Spinal manipulation, Spinal mobilization, DME instructions, Cryotherapy, Moist heat, and Biofeedback  PLAN FOR NEXT SESSION:  Complete Berg when appropriate.  HEp review/expansion.  Manual for TP release in glutes and  paraspinals.    Reyes LOISE London, PT 10/08/2023, 3:59 PM

## 2023-10-13 ENCOUNTER — Ambulatory Visit

## 2023-10-15 ENCOUNTER — Ambulatory Visit

## 2023-10-19 ENCOUNTER — Ambulatory Visit: Admitting: Physical Therapy

## 2023-10-21 ENCOUNTER — Ambulatory Visit

## 2023-10-26 ENCOUNTER — Encounter

## 2023-11-02 ENCOUNTER — Encounter

## 2023-11-05 ENCOUNTER — Encounter

## 2023-11-09 ENCOUNTER — Encounter

## 2023-11-17 ENCOUNTER — Encounter

## 2023-11-19 ENCOUNTER — Encounter: Admitting: Physical Therapy

## 2023-11-24 ENCOUNTER — Encounter

## 2023-11-26 ENCOUNTER — Encounter

## 2023-12-03 ENCOUNTER — Ambulatory Visit

## 2023-12-08 ENCOUNTER — Ambulatory Visit

## 2023-12-10 ENCOUNTER — Ambulatory Visit

## 2023-12-10 DIAGNOSIS — R262 Difficulty in walking, not elsewhere classified: Secondary | ICD-10-CM | POA: Insufficient documentation

## 2023-12-10 DIAGNOSIS — M25551 Pain in right hip: Secondary | ICD-10-CM | POA: Insufficient documentation

## 2023-12-10 DIAGNOSIS — R29898 Other symptoms and signs involving the musculoskeletal system: Secondary | ICD-10-CM | POA: Insufficient documentation

## 2023-12-10 DIAGNOSIS — M5459 Other low back pain: Secondary | ICD-10-CM | POA: Insufficient documentation

## 2023-12-10 NOTE — Therapy (Signed)
 OUTPATIENT PHYSICAL THERAPY LOWER EXTREMITY EVALUATION   Patient Name: Melody Steele MRN: [id number] DOB:Dec 11, 1955, 68 y.o., female Today's Date: 12/10/2023  END OF SESSION:  PT End of Session - 12/10/23 1626     Visit Number 1    Number of Visits 9    Date for PT Re-Evaluation 02/04/24    PT Start Time 1531    PT Stop Time 1615    PT Time Calculation (min) 44 min    Activity Tolerance Patient tolerated treatment well    Behavior During Therapy [facility] for tasks assessed/performed          Past Medical History:  Diagnosis Date   Allergic rhinitis    Arthritis    History of mammogram 2011; 08/29/13; 09/12/14   bibc neg; neg; neg   History of Papanicolaou smear of cervix 04/29/11; 08/29/13   -/-; -/-   Hypertension    Osteoarthritis    Osteopenia    -2.46 z score   Past Surgical History:  Procedure Laterality Date   APPENDECTOMY     AUGMENTATION MAMMAPLASTY Bilateral 1982   Taken out 1995   BILATERAL SALPINGOOPHORECTOMY  2003   COLONOSCOPY  07/2015   FOOT SURGERY  11/2011   HAND SURGERY     thumb   HY[facility]OSCOPY  2003   [facility]   LUMBAR DISC SURGERY  2006   SHOULDER ARTHROSCOPY  2008   SCRAPED BURSAL [facility]   Patient Active Problem List   Diagnosis Date Noted   Cervicalgia 01/06/2017   Acute pain of right shoulder 01/06/2017   Hypercholesterolemia 11/19/2016    PCP: Corlis Honor BROCKS., MD  REFERRING PROVIDER: Jud Thom Alexandria, PA-C  REFERRING DIAG: [phone] (ICD-10-CM) - Pain in right hip M54.50 (ICD-10-CM) - Low back pain, unspecified  THERAPY DIAG:  Other low back pain  Pain of right hip joint  Rationale for Evaluation and Treatment: Rehabilitation  ONSET DATE: June 2025  SUBJECTIVE:   SUBJECTIVE STATEMENT: Pt is a 68 y.o. female referred to OPPT for LBP and R hip pain.    PERTINENT HISTORY: Pt reported onset of R hip pain radiating about halfway down the thigh around mid June of 2025. Pt reported that she received an injection in the area in early July,  which helped for ~1 week; pain has not been over 5/10 since injection. Pt reports that pain has been 1-2/10 at best, has consistently been 1-2/10. Pt stating that lifting her son's walker, bending over, twisting aggravates pain. Pain is usually sharp, feels deep. R side in upper hip area, not radiating into the leg since receiving injection. Denies N/T.  PAIN:  Are you having pain? Yes: NPRS scale: 1-2/10 Pain location: LBP, upper glut med/max  Pain description: Sharp Aggravating factors: bending over, placing son's walker in and out of car, L twisting Relieving factors: Rest, ice  PRECAUTIONS: None  RED FLAGS: None   WEIGHT BEARING RESTRICTIONS: No  FALLS:  Has patient fallen in last 6 months? No  LIVING ENVIRONMENT: Lives with: lives with their family   PLOF: Independent  PATIENT GOALS: prevent pain from increasing again; resume usual activities  NEXT MD VISIT: N/A  OBJECTIVE:  Note: Objective measures were completed at Evaluation unless otherwise noted.  DIAGNOSTIC FINDINGS: N/A  PATIENT SURVEYS:  mODI: 7/50 = 14%  COGNITION: Overall cognitive status: Within functional limits for tasks assessed     SENSATION: WFL  POSTURE: No Significant postural limitations  PALPATION: TTP at R greater trochanter. Concordant pain sign at upper R  glut max medially near PSIS.   LOWER EXTREMITY ROM:  Active ROM Right eval Left eval  Hip flexion    Hip extension    Hip abduction    Hip adduction    Hip internal rotation    Hip external rotation    Knee flexion    Knee extension    Ankle dorsiflexion    Ankle plantarflexion    Ankle inversion    Ankle eversion     (Blank rows = not tested)  LUMBAR ROM: Trunk flexion: WFL; OP Trunk extensions: WFL; OP Lateral bending: WFL; pain going to the R Trunk rotation: WFL   LOWER EXTREMITY MMT:  MMT Right eval Left eval  Hip flexion 4* concordant pain  4  Hip extension    Hip abduction 4 4  Hip adduction    Hip  internal rotation 3+ 5  Hip external rotation 4 4  Knee flexion    Knee extension 5 5  Ankle dorsiflexion 5 5  Ankle plantarflexion 15 reps 15 reps  Ankle inversion    Ankle eversion     (Blank rows = not tested)  LOWER EXTREMITY SPECIAL TESTS:  FABER, FADIR neg on L & R Hip Scour R/L: Neg bilat  JOINT MOBILITY: Hip R/L AP: normal joint mobility bilat. Mild concordant pain with RLE.    FUNCTIONAL TESTS:  30 sec STS: 14 reps : 1439' with no pain  GAIT: Distance walked: 10 meters Assistive device utilized: None Level of assistance: Complete Independence Comments: Normal, reciprocal gait pattern                                                                                                                                TREATMENT DATE: 12/10/2023   Education on HEP, POC, prognosis. 8 minutes total  PATIENT EDUCATION:  Education details: HEP Person educated: Patient Education method: Chief Technology Officer Education comprehension: verbalized understanding and returned demonstration  HOME EXERCISE PROGRAM: Access Code: [email or web address] URL: [email or web address] Date: 12/10/2023 Prepared by: Dorina Kingfisher  Exercises - Supine Lower Trunk Rotation  - 1 x daily - 7 x weekly - 3 sets - 12 reps - Hooklying Single Knee to Chest Stretch  - 1 x daily - 7 x weekly - 1 sets - 3 reps - 30 hold - Supine Posterior Pelvic Tilt  - 1 x daily - 7 x weekly - 3 sets - 12 reps  ASSESSMENT:  CLINICAL IMPRESSION: Patient is a 68 y.o. female who was seen today for physical therapy evaluation and treatment for R sided LBP and hip pain. Pt presents with normal lumbar and hip ROM. No indications of discogenic or neural tension pain. TTP at R greater trochanter and upper glut max on R side that is concordant with some lumbar ROM and hip joint mobility. Pt with mild deficits in pain, proximal hip strength and general strength for age matched norms on 30 sec STS test. Pt will benefit  from  skilled PT services for 1-2x/week for 8 weeks to improve lumbopelvic and R hip ROM and pain to return to full community ADL's and pain free lifting of son's walker.   OBJECTIVE IMPAIRMENTS: decreased activity tolerance, decreased mobility, difficulty walking, decreased strength, and pain.   ACTIVITY LIMITATIONS: carrying, lifting, bending, and locomotion level  PARTICIPATION LIMITATIONS: community activity  PERSONAL FACTORS: Age, Fitness, Past/current experiences, and Time since onset of injury/illness/exacerbation are also affecting patient's functional outcome.   REHAB POTENTIAL: Good  CLINICAL DECISION MAKING: Stable/uncomplicated  EVALUATION COMPLEXITY: Low   GOALS: Goals reviewed with patient? No  SHORT TERM GOALS: Target date: 01/07/24 Pt will be independent with HEP to improve pain and lumbar/hip ROM.  Baseline: 12/10/23: Provided HEP  Goal status: INITIAL  LONG TERM GOALS: Target date: 02/04/24  Pt will improve 30 sec STS test to at least 15 reps for clinically significant improvement in LE strength for age matched norms.  Baseline: 12/10/23: 14 reps Goal status: INITIAL  2.  Pt will improve mODI by at least 6% to demonstrate clinically significant reduction in disability from LBP.  Baseline: 7/50 =14% Goal status: INITIAL  3.  Pt will report full elimination of LBP with functional lifting of loading/unloading son's rollator in/out of car. Baseline: 12/10/23: up to 5/10 NPS for loading/unloading walker. Goal status: INITIAL  PLAN:  PT FREQUENCY: 1-2x/week  PT DURATION: 8 weeks  PLANNED INTERVENTIONS: 97164- PT Re-evaluation, 97750- Physical Performance Testing, 97110-Therapeutic exercises, 97530- Therapeutic activity, [id number]- Neuromuscular re-education, 97535- Self Care, 02859- Manual therapy, [id number]- Gait training, [phone]- Electrical stimulation (unattended), [phone]- Electrical stimulation (manual), [id number]- Traction (mechanical), Patient/Family education, Balance  training, Joint mobilization, Joint manipulation, Spinal manipulation, Spinal mobilization, Cryotherapy, and Moist heat  PLAN FOR NEXT SESSION: lumbar joint mobility testing, functional lifting mechanics assessment. R glut max/hip mobility and lumbar pelvic control/ROM.    Dorina HERO. Fairly IV, PT, DPT Physical Therapist- [place]  [facility] 12/10/2023, 4:28 PM

## 2023-12-15 ENCOUNTER — Ambulatory Visit: Admitting: Physical Therapy

## 2023-12-15 DIAGNOSIS — M25551 Pain in right hip: Secondary | ICD-10-CM

## 2023-12-15 DIAGNOSIS — R262 Difficulty in walking, not elsewhere classified: Secondary | ICD-10-CM

## 2023-12-15 DIAGNOSIS — M5459 Other low back pain: Secondary | ICD-10-CM | POA: Diagnosis not present

## 2023-12-15 DIAGNOSIS — R29898 Other symptoms and signs involving the musculoskeletal system: Secondary | ICD-10-CM

## 2023-12-15 NOTE — Therapy (Signed)
 OUTPATIENT PHYSICAL THERAPY LOWER EXTREMITY TREATMENT   Patient Name: Melody Steele MRN: [id number] DOB:[birth year], 68 y.o., female Today's Date: 12/15/2023  END OF SESSION:  PT End of Session - 12/15/23 1622     Visit Number 2    Number of Visits 9    Date for PT Re-Evaluation 02/04/24    Progress Note Due on Visit 10    PT Start Time 1619    PT Stop Time 1659    PT Time Calculation (min) 40 min    Activity Tolerance Patient tolerated treatment well    Behavior During Therapy [facility] for tasks assessed/performed           Past Medical History:  Diagnosis Date   Allergic rhinitis    Arthritis    History of mammogram 2011; 08/29/13; 09/12/14   bibc neg; neg; neg   History of Papanicolaou smear of cervix 04/29/11; 08/29/13   -/-; -/-   Hypertension    Osteoarthritis    Osteopenia    -2.46 z score   Past Surgical History:  Procedure Laterality Date   APPENDECTOMY     AUGMENTATION MAMMAPLASTY Bilateral 1982   Taken out 1995   BILATERAL SALPINGOOPHORECTOMY  2003   COLONOSCOPY  07/2015   FOOT SURGERY  11/2011   HAND SURGERY     thumb   HY[facility]OSCOPY  2003   [facility]   LUMBAR DISC SURGERY  2006   SHOULDER ARTHROSCOPY  2008   SCRAPED BURSAL [facility]   Patient Active Problem List   Diagnosis Date Noted   Cervicalgia 01/06/2017   Acute pain of right shoulder 01/06/2017   Hypercholesterolemia 11/19/2016    PCP: Corlis Honor BROCKS., MD  REFERRING PROVIDER: Jud Thom [place], PA-C  REFERRING DIAG: [phone] (ICD-10-CM) - Pain in right hip M54.50 (ICD-10-CM) - Low back pain, unspecified  THERAPY DIAG:  Other low back pain  Pain of right hip joint  Difficulty in walking, not elsewhere classified  Weakness of right leg  Rationale for Evaluation and Treatment: Rehabilitation  ONSET DATE: June 2025  SUBJECTIVE:   SUBJECTIVE STATEMENT: Pt reports she has been working her HEP some but not as much as she should.    PERTINENT HISTORY: Pt reported onset of R hip pain radiating  about halfway down the thigh around mid June of 2025. Pt reported that she received an injection in the area in early July, which helped for ~1 week; pain has not been over 5/10 since injection. Pt reports that pain has been 1-2/10 at best, has consistently been 1-2/10. Pt stating that lifting her son's walker, bending over, twisting aggravates pain. Pain is usually sharp, feels deep. R side in upper hip area, not radiating into the leg since receiving injection. Denies N/T.  PAIN:  Are you having pain? Yes: NPRS scale: 1-2/10 Pain location: LBP, upper glut med/max  Pain description: Sharp Aggravating factors: bending over, placing son's walker in and out of car, L twisting Relieving factors: Rest, ice  PRECAUTIONS: None  RED FLAGS: None   WEIGHT BEARING RESTRICTIONS: No  FALLS:  Has patient fallen in last 6 months? No  LIVING ENVIRONMENT: Lives with: lives with their family   PLOF: Independent  PATIENT GOALS: prevent pain from increasing again; resume usual activities  NEXT MD VISIT: N/A  OBJECTIVE:  Note: Objective measures were completed at Evaluation unless otherwise noted.  DIAGNOSTIC FINDINGS: N/A  PATIENT SURVEYS:  mODI: 7/50 = 14%  COGNITION: Overall cognitive status: Within functional limits for tasks assessed  SENSATION: WFL  POSTURE: No Significant postural limitations  PALPATION: TTP at R greater trochanter. Concordant pain sign at upper R glut max medially near PSIS.   LOWER EXTREMITY ROM:  Active ROM Right eval Left eval  Hip flexion    Hip extension    Hip abduction    Hip adduction    Hip internal rotation    Hip external rotation    Knee flexion    Knee extension    Ankle dorsiflexion    Ankle plantarflexion    Ankle inversion    Ankle eversion     (Blank rows = not tested)  LUMBAR ROM: Trunk flexion: WFL; OP Trunk extensions: WFL; OP Lateral bending: WFL; pain going to the R Trunk rotation: WFL   LOWER EXTREMITY MMT:  MMT  Right eval Left eval  Hip flexion 4* concordant pain  4  Hip extension    Hip abduction 4 4  Hip adduction    Hip internal rotation 3+ 5  Hip external rotation 4 4  Knee flexion    Knee extension 5 5  Ankle dorsiflexion 5 5  Ankle plantarflexion 15 reps 15 reps  Ankle inversion    Ankle eversion     (Blank rows = not tested)  LOWER EXTREMITY SPECIAL TESTS:  FABER, FADIR neg on L & R Hip Scour R/L: Neg bilat  JOINT MOBILITY: Hip R/L AP: normal joint mobility bilat. Mild concordant pain with RLE.    FUNCTIONAL TESTS:  30 sec STS: 14 reps : 1439' with no pain  GAIT: Distance walked: 10 meters Assistive device utilized: None Level of assistance: Complete Independence Comments: Normal, reciprocal gait pattern                                                                                                                                TREATMENT DATE: 12/15/2023   Self care: instruction in lifting mechanics to prevent lumbar rotation under load particularly when loading her son's walker into car Log roll technique instruction getting in and out of bed   Manual: STM and ischemic release to R gluteal musculature x 12 min   TE- To improve strength, endurance, mobility, and function of specific targeted muscle groups or improve joint range of motion or improve muscle flexibility  PT stretch figure 4 and piriformis stretch 2 x 45sec ea R LE only  LTR x 10 ea side x 5 sec  Supine clam GTB x 20 reps  Supine bridge 2 x 10- instruction for limited motion to limit lumbar extension  Supine TrA contraction 2 x 10 x 3 sec hold  PATIENT EDUCATION:  Education details: HEP Person educated: Patient Education method: Explanation and Handouts Education comprehension: verbalized understanding and returned demonstration  HOME EXERCISE PROGRAM: Access Code: [email or web address] URL: [email or web address] Date: 12/10/2023 Prepared by: Dorina Kingfisher  Exercises - Supine Lower  Trunk Rotation  - 1 x daily - 7 x weekly - 3 sets - 12  reps - Hooklying Single Knee to Chest Stretch  - 1 x daily - 7 x weekly - 1 sets - 3 reps - 30 hold - Supine Posterior Pelvic Tilt  - 1 x daily - 7 x weekly - 3 sets - 12 reps  ASSESSMENT:  CLINICAL IMPRESSION:  Patient presents with good motivation for completion of physical therapy activities.  Patient instructed in various ways to improve her spine hygiene performing various activities including getting in and out of bed as well as placing her son's walker into and out of the car.  Patient has large trigger point in right gluteal region endorses manual therapy this date but still present and bothersome post session.Pt will continue to benefit from skilled physical therapy intervention to address impairments, improve QOL, and attain therapy goals.   OBJECTIVE IMPAIRMENTS: decreased activity tolerance, decreased mobility, difficulty walking, decreased strength, and pain.   ACTIVITY LIMITATIONS: carrying, lifting, bending, and locomotion level  PARTICIPATION LIMITATIONS: community activity  PERSONAL FACTORS: Age, Fitness, Past/current experiences, and Time since onset of injury/illness/exacerbation are also affecting patient's functional outcome.   REHAB POTENTIAL: Good  CLINICAL DECISION MAKING: Stable/uncomplicated  EVALUATION COMPLEXITY: Low   GOALS: Goals reviewed with patient? No  SHORT TERM GOALS: Target date: 01/07/24 Pt will be independent with HEP to improve pain and lumbar/hip ROM.  Baseline: 12/10/23: Provided HEP  Goal status: INITIAL  LONG TERM GOALS: Target date: 02/04/24  Pt will improve 30 sec STS test to at least 15 reps for clinically significant improvement in LE strength for age matched norms.  Baseline: 12/10/23: 14 reps Goal status: INITIAL  2.  Pt will improve mODI by at least 6% to demonstrate clinically significant reduction in disability from LBP.  Baseline: 7/50 =14% Goal status: INITIAL  3.  Pt  will report full elimination of LBP with functional lifting of loading/unloading son's rollator in/out of car. Baseline: 12/10/23: up to 5/10 NPS for loading/unloading walker. Goal status: INITIAL  PLAN:  PT FREQUENCY: 1-2x/week  PT DURATION: 8 weeks  PLANNED INTERVENTIONS: 97164- PT Re-evaluation, 97750- Physical Performance Testing, 97110-Therapeutic exercises, 97530- Therapeutic activity, [id number]- Neuromuscular re-education, 97535- Self Care, 02859- Manual therapy, [id number]- Gait training, [phone]- Electrical stimulation (unattended), [phone]- Electrical stimulation (manual), [id number]- Traction (mechanical), Patient/Family education, Balance training, Joint mobilization, Joint manipulation, Spinal manipulation, Spinal mobilization, Cryotherapy, and Moist heat  PLAN FOR NEXT SESSION: lumbar joint mobility testing,  R glut max/hip mobility and lumbar pelvic control/ROM.   Note: Portions of this document were prepared using Dragon voice recognition software and although reviewed may contain unintentional dictation errors in syntax, grammar, or spelling.  Lonni KATHEE Gainer PT ,DPT Physical Therapist- [facility]   12/15/2023, 4:44 PM

## 2023-12-22 ENCOUNTER — Encounter

## 2023-12-23 ENCOUNTER — Ambulatory Visit

## 2023-12-23 DIAGNOSIS — M5459 Other low back pain: Secondary | ICD-10-CM | POA: Diagnosis not present

## 2023-12-23 DIAGNOSIS — R262 Difficulty in walking, not elsewhere classified: Secondary | ICD-10-CM

## 2023-12-23 DIAGNOSIS — R29898 Other symptoms and signs involving the musculoskeletal system: Secondary | ICD-10-CM

## 2023-12-23 DIAGNOSIS — M25551 Pain in right hip: Secondary | ICD-10-CM

## 2023-12-23 NOTE — Therapy (Addendum)
 OUTPATIENT PHYSICAL THERAPY TREATMENT   Patient Name: Melody Steele MRN: [id number] DOB:December 06, 1955, 67 y.o., female Today's Date: 12/23/2023  END OF SESSION:  PT End of Session - 12/23/23 1023     Visit Number 3    Number of Visits 9    Date for Recertification  02/04/24    Progress Note Due on Visit 10          Past Medical History:  Diagnosis Date   Allergic rhinitis    Arthritis    History of mammogram 2011; 08/29/13; 09/12/14   bibc neg; neg; neg   History of Papanicolaou smear of cervix 04/29/11; 08/29/13   -/-; -/-   Hypertension    Osteoarthritis    Osteopenia    -2.46 z score   Past Surgical History:  Procedure Laterality Date   APPENDECTOMY     AUGMENTATION MAMMAPLASTY Bilateral 1982   Taken out 1995   BILATERAL SALPINGOOPHORECTOMY  2003   COLONOSCOPY  07/2015   FOOT SURGERY  11/2011   HAND SURGERY     thumb   HYSTEROSCOPY  2003   [facility]   LUMBAR DISC SURGERY  2006   SHOULDER ARTHROSCOPY  2008   SCRAPED BURSAL [facility]   Patient Active Problem List   Diagnosis Date Noted   Cervicalgia 01/06/2017   Acute pain of right shoulder 01/06/2017   Hypercholesterolemia 11/19/2016    PCP: Corlis Honor BROCKS., MD  REFERRING PROVIDER: Jud Thom [place], PA-C  REFERRING DIAG: [phone] (ICD-10-CM) - Pain in right hip M54.50 (ICD-10-CM) - Low back pain, unspecified  THERAPY DIAG:  Other low back pain  Pain of right hip joint  Difficulty in walking, not elsewhere classified  Weakness of right leg  Rationale for Evaluation and Treatment: Rehabilitation  ONSET DATE: June 2025  SUBJECTIVE:   SUBJECTIVE STATEMENT: Pt reports she has been working her HEP some but not as much as she should.    PERTINENT HISTORY: Pt reported onset of R hip pain radiating about halfway down the thigh around mid June of 2025. Pt reported that she received an injection in the area in early July, which helped for ~1 week; pain has not been over 5/10 since injection. Pt reports that pain  has been 1-2/10 at best, has consistently been 1-2/10. Pt stating that lifting her son's walker, bending over, twisting aggravates pain. Pain is usually sharp, feels deep. R side in upper hip area, not radiating into the leg since receiving injection. Denies N/T.  PAIN:  Are you having pain? No pain on arrival, but on Nustep has patellafemoral pain 8/10 bilat   PRECAUTIONS: None  WEIGHT BEARING RESTRICTIONS: No  FALLS:  Has patient fallen in last 6 months? No  PATIENT GOALS: prevent pain from increasing again; resume usual activities  NEXT MD VISIT: N/A  OBJECTIVE:    LOWER EXTREMITY MMT:  MMT Right eval Left eval  Hip flexion 4* concordant pain  4  Hip extension    Hip abduction 4 4  Hip adduction    Hip internal rotation 3+ 5  Hip external rotation 4 4  Knee flexion    Knee extension 5 5  Ankle dorsiflexion 5 5  Ankle plantarflexion 15 reps 15 reps  Ankle inversion    Ankle eversion     (Blank rows = not tested)  LOWER EXTREMITY SPECIAL TESTS:  FABER, FADIR neg on L & R Hip Scour R/L: Neg bilat  JOINT MOBILITY: Hip R/L AP: normal joint mobility bilat. Mild concordant pain with  RLE.   FUNCTIONAL TESTS:  30 sec STS: 14 reps : 1439' with no pain  GAIT: Distance walked: 10 meters Assistive device utilized: None Level of assistance: Complete Independence Comments: Normal, reciprocal gait pattern                                                                                                                                TREATMENT DATE: 12/23/2023   -AA/ROM on Nustep, seat 5, no arms per preference, level 3 (4 minutes- bilat patellafemoral pain for first time)  -ball squeeze bridge x20 -single leg dead bug holds 16x10secH  -ball squeeze bridge x20 -single leg dead bug holds 16x10secH  -MFR Rt lateral glutes, posterolaterosuperio glutes  -double leg reverse curl up x15  -short sitting YTB reverse clam with green knee ball 1x20 -lateral stepping in the  bars that lie parallel, redTB, 4x right and left  -lateral stepping in the bars that lie parallel, redTB, 4x right and left    PATIENT EDUCATION:  Education details: HEP Person educated: Patient Education method: Chief Technology Officer Education comprehension: verbalized understanding and returned demonstration  HOME EXERCISE PROGRAM: Access Code: [email or web address] URL: [email or web address] Date: 12/10/2023 Prepared by: Dorina Kingfisher  Exercises - Supine Lower Trunk Rotation  - 1 x daily - 7 x weekly - 3 sets - 12 reps - Hooklying Single Knee to Chest Stretch  - 1 x daily - 7 x weekly - 1 sets - 3 reps - 30 hold - Supine Posterior Pelvic Tilt  - 1 x daily - 7 x weekly - 3 sets - 12 reps  ASSESSMENT:  CLINICAL IMPRESSION: Good response to gluteal release, also good repsonse to gluteal loading. Avoided positions of physiologicac gluteal tendon compression. Symptoms largely stable during the week. Pt would benefit from deadlift interventions. Pt will continue to benefit from skilled physical therapy intervention to address impairments, improve QOL, and attain therapy goals.   OBJECTIVE IMPAIRMENTS: decreased activity tolerance, decreased mobility, difficulty walking, decreased strength, and pain.   ACTIVITY LIMITATIONS: carrying, lifting, bending, and locomotion level  PARTICIPATION LIMITATIONS: community activity  PERSONAL FACTORS: Age, Fitness, Past/current experiences, and Time since onset of injury/illness/exacerbation are also affecting patient's functional outcome.   REHAB POTENTIAL: Good  CLINICAL DECISION MAKING: Stable/uncomplicated  EVALUATION COMPLEXITY: Low   GOALS: Goals reviewed with patient? No  SHORT TERM GOALS: Target date: 01/07/24 Pt will be independent with HEP to improve pain and lumbar/hip ROM.  Baseline: 12/10/23: Provided HEP  Goal status: INITIAL  LONG TERM GOALS: Target date: 02/04/24  Pt will improve 30 sec STS test to at least 15 reps  for clinically significant improvement in LE strength for age matched norms.  Baseline: 12/10/23: 14 reps Goal status: INITIAL  2.  Pt will improve mODI by at least 6% to demonstrate clinically significant reduction in disability from LBP.  Baseline: 7/50 =14% Goal status: INITIAL  3.  Pt will report full elimination of LBP with functional  lifting of loading/unloading son's rollator in/out of car. Baseline: 12/10/23: up to 5/10 NPS for loading/unloading walker. Goal status: INITIAL  PLAN:  PT FREQUENCY: 1-2x/week  PT DURATION: 8 weeks  PLANNED INTERVENTIONS: 97164- PT Re-evaluation, 97750- Physical Performance Testing, 97110-Therapeutic exercises, 97530- Therapeutic activity, [id number]- Neuromuscular re-education, 97535- Self Care, 02859- Manual therapy, [id number]- Gait training, [phone]- Electrical stimulation (unattended), [phone]- Electrical stimulation (manual), [id number]- Traction (mechanical), Patient/Family education, Balance training, Joint mobilization, Joint manipulation, Spinal manipulation, Spinal mobilization, Cryotherapy, and Moist heat  PLAN FOR NEXT SESSION: lumbar joint mobility testing,  R glut max/hip mobility and lumbar pelvic control/ROM.   10:27 AM, 12/23/23 Peggye JAYSON Linear, PT, DPT Physical Therapist -  [facility]  Outpatient Physical Therapy- Main Campus [phone]    *addendum to note on 12/30/23 to remove intervention items remaining from prior session, not performed at this visit.  9:08 AM, 12/30/23 Peggye JAYSON Linear, PT, DPT Physical Therapist - [facility]  [phone] [facility])

## 2023-12-30 ENCOUNTER — Ambulatory Visit

## 2023-12-30 DIAGNOSIS — M25551 Pain in right hip: Secondary | ICD-10-CM | POA: Diagnosis present

## 2023-12-30 DIAGNOSIS — R29898 Other symptoms and signs involving the musculoskeletal system: Secondary | ICD-10-CM | POA: Insufficient documentation

## 2023-12-30 DIAGNOSIS — R262 Difficulty in walking, not elsewhere classified: Secondary | ICD-10-CM | POA: Insufficient documentation

## 2023-12-30 DIAGNOSIS — M5459 Other low back pain: Secondary | ICD-10-CM | POA: Diagnosis present

## 2023-12-30 NOTE — Therapy (Signed)
 OUTPATIENT PHYSICAL THERAPY TREATMENT   Patient Name: Melody Steele MRN: [id number] DOB:Jun 02, 1955, 68 y.o., female Today's Date: 12/30/2023  END OF SESSION:  PT End of Session - 12/30/23 1022     Visit Number 4    Number of Visits 9    Date for Recertification  02/04/24    Authorization Type [employer]    Progress Note Due on Visit 10    PT Start Time 1018    PT Stop Time 1058    PT Time Calculation (min) 40 min    Equipment Utilized During Treatment Gait belt    Activity Tolerance Patient tolerated treatment well    Behavior During Therapy WFL for tasks assessed/performed          Past Medical History:  Diagnosis Date   Allergic rhinitis    Arthritis    History of mammogram 2011; 08/29/13; 09/12/14   bibc neg; neg; neg   History of Papanicolaou smear of cervix 04/29/11; 08/29/13   -/-; -/-   Hypertension    Osteoarthritis    Osteopenia    -2.46 z score   Past Surgical History:  Procedure Laterality Date   APPENDECTOMY     AUGMENTATION MAMMAPLASTY Bilateral 1982   Taken out 1995   BILATERAL SALPINGOOPHORECTOMY  2003   COLONOSCOPY  07/2015   FOOT SURGERY  11/2011   HAND SURGERY     thumb   HYSTEROSCOPY  2003   [facility]   LUMBAR DISC SURGERY  2006   SHOULDER ARTHROSCOPY  2008   S[facility]URSAL [facility]   Patient Active Problem List   Diagnosis Date Noted   Cervicalgia 01/06/2017   Acute pain of right shoulder 01/06/2017   Hypercholesterolemia 11/19/2016    PCP: Corlis Honor BROCKS., MD  REFERRING PROVIDER: Jud Thom Alexandria, PA-C  REFERRING DIAG: [phone] (ICD-10-CM) - Pain in right hip M54.50 (ICD-10-CM) - Low back pain, unspecified  THERAPY DIAG:  Other low back pain  Pain of right hip joint  Difficulty in walking, not elsewhere classified  Rationale for Evaluation and Treatment: Rehabilitation  ONSET DATE: June 2025  SUBJECTIVE:   SUBJECTIVE STATEMENT: Pt reports doing well today. Pain generally low. Pt no longer has BP meds due to change in MD and  not being established with new PCP yet- KC recommended coming to [facility] today for script.    PERTINENT HISTORY: Pt reported onset of R hip pain radiating about halfway down the thigh around mid June of 2025. Pt reported that she received an injection in the area in early July, which helped for ~1 week; pain has not been over 5/10 since injection. Pt reports that pain has been 1-2/10 at best, has consistently been 1-2/10. Pt stating that lifting her son's walker, bending over, twisting aggravates pain. Pain is usually sharp, feels deep. R side in upper hip area, not radiating into the leg since receiving injection. Denies N/T.  PAIN:  Are you having pain? No pain on arrival, but on Nustep has patellafemoral pain 8/10 bilat   PRECAUTIONS: None  WEIGHT BEARING RESTRICTIONS: No  FALLS:  Has patient fallen in last 6 months? No  PATIENT GOALS: prevent pain from increasing again; resume usual activities  NEXT MD VISIT: N/A  OBJECTIVE:    LOWER EXTREMITY MMT:  MMT Right eval Left eval  Hip flexion 4* concordant pain  4  Hip extension    Hip abduction 4 4  Hip adduction    Hip internal rotation 3+ 5  Hip external rotation 4  4  Knee flexion    Knee extension 5 5  Ankle dorsiflexion 5 5  Ankle plantarflexion 15 reps 15 reps  Ankle inversion    Ankle eversion     (Blank rows = not tested)  LOWER EXTREMITY SPECIAL TESTS:  FABER, FADIR neg on L & R Hip Scour R/L: Neg bilat  JOINT MOBILITY: Hip R/L AP: normal joint mobility bilat. Mild concordant pain with RLE.   FUNCTIONAL TESTS:  30 sec STS: 14 reps : 1439' with no pain  GAIT: Distance walked: 10 meters Assistive device utilized: None Level of assistance: Complete Independence Comments: Normal, reciprocal gait pattern                                                                                                                                TREATMENT DATE: 12/30/2023  -AA/ROM on Nustep, seat 6, no arms per preference,  level 2 x 3 minutes  *knee pain inset at 2 minutes -red physioball rollout x20, then 15 to forward and Left (right low back stretch)  -Wellzone leg press seat 4, plate 6 [id number] -RDL 15lb KB 2x15 (set on tall yoga block)  -GreenTB side stepping in bars x15 5x, then backward stepping x8 -cable resisted pallov press with slight rotation, 7.5lb x10 bilat  -bilat cable shoulder extension from overhead to pants pockets x12 12.5lb c mitre grip   Previous Session: -MFR Rt lateral glutes, posterolaterosuperio glutes  -double leg reverse curl up x15  -short sitting YTB reverse clam with green knee ball 1x20 -lateral stepping in the bars that lie parallel, redTB, 4x right and left  -lateral stepping in the bars that lie parallel, redTB, 4x right and left    PATIENT EDUCATION:  Education details: HEP Person educated: Patient Education method: Chief Technology Officer Education comprehension: verbalized understanding and returned demonstration  HOME EXERCISE PROGRAM: Access Code: [email or web address] URL: [email or web address] Date: 12/10/2023 Prepared by: Dorina Kingfisher  Exercises - Supine Lower Trunk Rotation  - 1 x daily - 7 x weekly - 3 sets - 12 reps - Hooklying Single Knee to Chest Stretch  - 1 x daily - 7 x weekly - 1 sets - 3 reps - 30 hold - Supine Posterior Pelvic Tilt  - 1 x daily - 7 x weekly - 3 sets - 12 reps  ASSESSMENT:  CLINICAL IMPRESSION: Advanced back strengthening interventions this date. Also advanced core exercises. Pt does generally wel, but knees remain somewhat easily aggravated wth some activities. Pt will continue to benefit from skilled physical therapy intervention to address impairments, improve QOL, and attain therapy goals.   OBJECTIVE IMPAIRMENTS: decreased activity tolerance, decreased mobility, difficulty walking, decreased strength, and pain.  ACTIVITY LIMITATIONS: carrying, lifting, bending, and locomotion level PARTICIPATION LIMITATIONS: community  activity PERSONAL FACTORS: Age, Fitness, Past/current experiences, and Time since onset of injury/illness/exacerbation are also affecting patient's functional outcome.  REHAB POTENTIAL: Good CLINICAL DECISION MAKING: Stable/uncomplicated EVALUATION COMPLEXITY: Low  GOALS: Goals reviewed  with patient? No  SHORT TERM GOALS: Target date: 01/07/24 Pt will be independent with HEP to improve pain and lumbar/hip ROM.  Baseline: 12/10/23: Provided HEP  Goal status: INITIAL  LONG TERM GOALS: Target date: 02/04/24  Pt will improve 30 sec STS test to at least 15 reps for clinically significant improvement in LE strength for age matched norms.  Baseline: 12/10/23: 14 reps Goal status: INITIAL  2.  Pt will improve mODI by at least 6% to demonstrate clinically significant reduction in disability from LBP.  Baseline: 7/50 =14% Goal status: INITIAL  3.  Pt will report full elimination of LBP with functional lifting of loading/unloading son's rollator in/out of car. Baseline: 12/10/23: up to 5/10 NPS for loading/unloading walker. Goal status: INITIAL  PLAN:  PT FREQUENCY: 1-2x/week  PT DURATION: 8 weeks  PLANNED INTERVENTIONS: 97164- PT Re-evaluation, 97750- Physical Performance Testing, 97110-Therapeutic exercises, 97530- Therapeutic activity, [id number]- Neuromuscular re-education, 97535- Self Care, 02859- Manual therapy, [id number]- Gait training, [phone]- Electrical stimulation (unattended), [phone]- Electrical stimulation (manual), [id number]- Traction (mechanical), Patient/Family education, Balance training, Joint mobilization, Joint manipulation, Spinal manipulation, Spinal mobilization, Cryotherapy, and Moist heat  PLAN FOR NEXT SESSION: lumbar joint mobility testing,  R glute max/hip mobility and lumbar pelvic control/ROM.   10:26 AM, 12/30/23 Peggye JAYSON Linear, PT, DPT Physical Therapist - [facility]  Outpatient Physical Therapy- Main Campus [phone]

## 2024-01-05 ENCOUNTER — Encounter

## 2024-01-06 ENCOUNTER — Ambulatory Visit

## 2024-01-06 DIAGNOSIS — R262 Difficulty in walking, not elsewhere classified: Secondary | ICD-10-CM

## 2024-01-06 DIAGNOSIS — M5459 Other low back pain: Secondary | ICD-10-CM

## 2024-01-06 DIAGNOSIS — R29898 Other symptoms and signs involving the musculoskeletal system: Secondary | ICD-10-CM

## 2024-01-06 DIAGNOSIS — M25551 Pain in right hip: Secondary | ICD-10-CM

## 2024-01-06 NOTE — Therapy (Signed)
 OUTPATIENT PHYSICAL THERAPY TREATMENT   Patient Name: Melody Steele MRN: [id number] DOB:1956-01-25, 68 y.o., female Today's Date: 01/06/2024  END OF SESSION:  PT End of Session - 01/06/24 1033     Visit Number 5    Number of Visits 9    Date for Recertification  02/04/24    Authorization Type [employer]    Progress Note Due on Visit 10    PT Start Time 1018    PT Stop Time 1058    PT Time Calculation (min) 40 min    Equipment Utilized During Treatment Gait belt    Activity Tolerance Patient tolerated treatment well;No increased pain          Past Medical History:  Diagnosis Date   Allergic rhinitis    Arthritis    History of mammogram 2011; 08/29/13; 09/12/14   bibc neg; neg; neg   History of Papanicolaou smear of cervix 04/29/11; 08/29/13   -/-; -/-   Hypertension    Osteoarthritis    Osteopenia    -2.46 z score   Past Surgical History:  Procedure Laterality Date   APPENDECTOMY     AUGMENTATION MAMMAPLASTY Bilateral 1982   Taken out 1995   BILATERAL SALPINGOOPHORECTOMY  2003   COLONOSCOPY  07/2015   FOOT SURGERY  11/2011   HAND SURGERY     thumb   HYSTEROSCOPY  2003   [facility]   LUMBAR DISC SURGERY  2006   SHOULDER ARTHROSCOPY  2008   SCR[facility] BURSAL [facility]   Patient Active Problem List   Diagnosis Date Noted   Cervicalgia 01/06/2017   Acute pain of right shoulder 01/06/2017   Hypercholesterolemia 11/19/2016    PCP: Corlis Honor BROCKS., MD  REFERRING PROVIDER: Jud Thom [place], PA-C  REFERRING DIAG: [phone] (ICD-10-CM) - Pain in right hip M54.50 (ICD-10-CM) - Low back pain, unspecified  THERAPY DIAG:  Other low back pain  Pain of right hip joint  Difficulty in walking, not elsewhere classified  Weakness of right leg  Rationale for Evaluation and Treatment: Rehabilitation  ONSET DATE: June 2025  SUBJECTIVE:   SUBJECTIVE STATEMENT: Knee continue to feel better, toelrates nustep at [facility] yesterday. Back/hip has been doing good.    PERTINENT  HISTORY: Pt reported onset of R hip pain radiating about halfway down the thigh around mid June of 2025. Pt reported that she received an injection in the area in early July, which helped for ~1 week; pain has not been over 5/10 since injection. Pt reports that pain has been 1-2/10 at best, has consistently been 1-2/10. Pt stating that lifting her son's walker, bending over, twisting aggravates pain. Pain is usually sharp, feels deep. R side in upper hip area, not radiating into the leg since receiving injection. Denies N/T.  PAIN:  Are you having pain? No pain on arrival, but on Nustep has patellafemoral pain 8/10 bilat   PRECAUTIONS: None  WEIGHT BEARING RESTRICTIONS: No  FALLS:  Has patient fallen in last 6 months? No  PATIENT GOALS: prevent pain from increasing again; resume usual activities  NEXT MD VISIT: N/A  OBJECTIVE:   LOWER EXTREMITY MMT:  MMT Right eval Left eval  Hip flexion 4* concordant pain  4  Hip extension    Hip abduction 4 4  Hip adduction    Hip internal rotation 3+ 5  Hip external rotation 4 4  Knee flexion    Knee extension 5 5  Ankle dorsiflexion 5 5  Ankle plantarflexion 15 reps 15 reps  Ankle inversion    Ankle eversion     (Blank rows = not tested)  LOWER EXTREMITY SPECIAL TESTS:  FABER, FADIR neg on L & R Hip Scour R/L: Neg bilat  JOINT MOBILITY: Hip R/L AP: normal joint mobility bilat. Mild concordant pain with RLE.   FUNCTIONAL TESTS:  30 sec STS: 14 reps : 1439' with no pain  GAIT: Distance walked: 10 meters Assistive device utilized: None Level of assistance: Complete Independence Comments: Normal, reciprocal gait pattern                                                                                                                               TREATMENT DATE: 01/06/2024 -AMB overground 940ft, no device 50m38s -red physioball rollout x20, then 15 to forward and Left (right low back stretch)   -In wellzone leg press, seat 4,  plate 6, 8k84, 4 plates on lat pull for 35sec stretch, leg press, seat 4, plate 6, 8k84, 4 plates on lat pull for 35sec stretch, leg press, seat 4, plate 6, 8k84  -RDL 15lb KB from tall yoga brick x12  -cable lateral twist 7.5;lb 1x15 bilat  -RDL 15lb KB from tall yoga brick x12  -cable lateral twist 7.5;lb 1x15 bilat   -SLS c hip/knee at 90/90: 5x10sec H bilat (2.5lb AW on left only)  -SLS c hip/knee at 90/90: 3x10sec H bilat (2.5lb AW on left only)   01/04/24 -AA/ROM on Nustep, seat 6, no arms per preference, level 2 x 3 minutes  *knee pain inset at 2 minutes -red physioball rollout x20, then 15 to forward and Left (right low back stretch)  -Wellzone leg press seat 4, plate 6 7k84 -RDL 15lb KB 2x15 (set on tall yoga block)  -GreenTB side stepping in bars x15 5x, then backward stepping x8 -cable resisted pallov press with slight rotation, 7.5lb x10 bilat  -bilat cable shoulder extension from overhead to pants pockets x12 12.5lb c mitre grip   Previous Session: -MFR Rt lateral glutes, posterolaterosuperio glutes  -double leg reverse curl up x15  -short sitting YTB reverse clam with green knee ball 1x20 -lateral stepping in the bars that lie parallel, redTB, 4x right and left  -lateral stepping in the bars that lie parallel, redTB, 4x right and left    PATIENT EDUCATION:  Education details: HEP Person educated: Patient Education method: Chief Technology Officer Education comprehension: verbalized understanding and returned demonstration  HOME EXERCISE PROGRAM: Access Code: [email or web address] URL: [email or web address] Date: 12/10/2023 Prepared by: Dorina Kingfisher  Exercises - Supine Lower Trunk Rotation  - 1 x daily - 7 x weekly - 3 sets - 12 reps - Hooklying Single Knee to Chest Stretch  - 1 x daily - 7 x weekly - 1 sets - 3 reps - 30 hold - Supine Posterior Pelvic Tilt  - 1 x daily - 7 x weekly - 3 sets - 12 reps  ASSESSMENT:  CLINICAL IMPRESSION: Advanced back  strengthening interventions  this date. Also advanced core exercises. Knee pain improving. Pt will continue to benefit from skilled physical therapy intervention to address impairments, improve QOL, and attain therapy goals.   OBJECTIVE IMPAIRMENTS: decreased activity tolerance, decreased mobility, difficulty walking, decreased strength, and pain.  ACTIVITY LIMITATIONS: carrying, lifting, bending, and locomotion level PARTICIPATION LIMITATIONS: community activity PERSONAL FACTORS: Age, Fitness, Past/current experiences, and Time since onset of injury/illness/exacerbation are also affecting patient's functional outcome.  REHAB POTENTIAL: Good CLINICAL DECISION MAKING: Stable/uncomplicated EVALUATION COMPLEXITY: Low  GOALS: Goals reviewed with patient? No  SHORT TERM GOALS: Target date: 01/07/24 Pt will be independent with HEP to improve pain and lumbar/hip ROM.  Baseline: 12/10/23: Provided HEP  Goal status: INITIAL  LONG TERM GOALS: Target date: 02/04/24  Pt will improve 30 sec STS test to at least 15 reps for clinically significant improvement in LE strength for age matched norms.  Baseline: 12/10/23: 14 reps Goal status: INITIAL  2.  Pt will improve mODI by at least 6% to demonstrate clinically significant reduction in disability from LBP.  Baseline: 7/50 =14% Goal status: INITIAL  3.  Pt will report full elimination of LBP with functional lifting of loading/unloading son's rollator in/out of car. Baseline: 12/10/23: up to 5/10 NPS for loading/unloading walker. Goal status: INITIAL  PLAN:  PT FREQUENCY: 1-2x/week  PT DURATION: 8 weeks  PLANNED INTERVENTIONS: 97164- PT Re-evaluation, 97750- Physical Performance Testing, 97110-Therapeutic exercises, 97530- Therapeutic activity, 97112- Neuromuscular re-education, [phone]- Self Care, 02859- Manual therapy, [phone]- Gait training, [phone]- Electrical stimulation (unattended), [phone]- Electrical stimulation (manual), [id number]- Traction  (mechanical), Patient/Family education, Balance training, Joint mobilization, Joint manipulation, Spinal manipulation, Spinal mobilization, Cryotherapy, and Moist heat  PLAN FOR NEXT SESSION: Core strength, core strength, and core strength   10:44 AM, 01/06/24 Peggye JAYSON Linear, PT, DPT Physical Therapist - [facility]  Outpatient Physical Therapy- [facility] [phone]

## 2024-01-11 ENCOUNTER — Ambulatory Visit

## 2024-01-12 ENCOUNTER — Encounter

## 2024-01-13 ENCOUNTER — Ambulatory Visit

## 2024-01-19 ENCOUNTER — Encounter

## 2024-01-21 ENCOUNTER — Ambulatory Visit

## 2024-01-26 ENCOUNTER — Encounter

## 2024-01-27 ENCOUNTER — Ambulatory Visit: Admitting: Physical Therapy

## 2024-01-27 DIAGNOSIS — M5459 Other low back pain: Secondary | ICD-10-CM | POA: Diagnosis not present

## 2024-01-27 DIAGNOSIS — M25551 Pain in right hip: Secondary | ICD-10-CM

## 2024-01-27 DIAGNOSIS — R29898 Other symptoms and signs involving the musculoskeletal system: Secondary | ICD-10-CM

## 2024-01-27 NOTE — Therapy (Signed)
 OUTPATIENT PHYSICAL THERAPY TREATMENT   Patient Name: Melody Steele MRN: [id number] DOB:1955/07/18, 68 y.o., female Today's Date: 01/27/2024  END OF SESSION:  PT End of Session - 01/27/24 0853     Visit Number 6    Number of Visits 9    Date for Recertification  02/04/24    Authorization Type [employer] Medicare    Progress Note Due on Visit 10    PT Start Time [phone]    PT Stop Time 0929    PT Time Calculation (min) 38 min    Equipment Utilized During Treatment Gait belt    Activity Tolerance Patient tolerated treatment well;No increased pain           Past Medical History:  Diagnosis Date   Allergic rhinitis    Arthritis    History of mammogram 2011; 08/29/13; 09/12/14   bibc neg; neg; neg   History of Papanicolaou smear of cervix 04/29/11; 08/29/13   -/-; -/-   Hypertension    Osteoarthritis    Osteopenia    -2.46 z score   Past Surgical History:  Procedure Laterality Date   APPENDECTOMY     AUGMENTATION MAMMAPLASTY Bilateral 1982   Taken out 1995   BILATERAL SALPINGOOPHORECTOMY  2003   COLONOSCOPY  07/2015   FOOT SURGERY  11/2011   HAND SURGERY     thumb   [facility]COPY  2003   [facility]   LUMBAR DISC SURGERY  2006   SHOULDER ARTHROSCOPY  2008   [facility]   Patient Active Problem List   Diagnosis Date Noted   Cervicalgia 01/06/2017   Acute pain of right shoulder 01/06/2017   Hypercholesterolemia 11/19/2016    PCP: Corlis Honor BROCKS., MD  REFERRING PROVIDER: Jud Thom [place], PA-C  REFERRING DIAG: [phone] (ICD-10-CM) - Pain in right hip M54.50 (ICD-10-CM) - Low back pain, unspecified  THERAPY DIAG:  Other low back pain  Pain of right hip joint  Weakness of right leg  Rationale for Evaluation and Treatment: Rehabilitation  ONSET DATE: June 2025  SUBJECTIVE:   SUBJECTIVE STATEMENT: Pt has been sick but is feeling better. Pt has not been to gym due to her and Redell being sick.   PERTINENT HISTORY: Pt reported onset of R hip pain radiating  about halfway down the thigh around mid June of 2025. Pt reported that she received an injection in the area in early July, which helped for ~1 week; pain has not been over 5/10 since injection. Pt reports that pain has been 1-2/10 at best, has consistently been 1-2/10. Pt stating that lifting her son's walker, bending over, twisting aggravates pain. Pain is usually sharp, feels deep. R side in upper hip area, not radiating into the leg since receiving injection. Denies N/T.  PAIN:  Are you having pain? No pain on arrival, but on Nustep has patellafemoral pain 8/10 bilat   PRECAUTIONS: None  WEIGHT BEARING RESTRICTIONS: No  FALLS:  Has patient fallen in last 6 months? No  PATIENT GOALS: prevent pain from increasing again; resume usual activities  NEXT MD VISIT: N/A  OBJECTIVE:   LOWER EXTREMITY MMT:  MMT Right eval Left eval  Hip flexion 4* concordant pain  4  Hip extension    Hip abduction 4 4  Hip adduction    Hip internal rotation 3+ 5  Hip external rotation 4 4  Knee flexion    Knee extension 5 5  Ankle dorsiflexion 5 5  Ankle plantarflexion 15 reps 15 reps  Ankle  inversion    Ankle eversion     (Blank rows = not tested)  LOWER EXTREMITY SPECIAL TESTS:  FABER, FADIR neg on L & R Hip Scour R/L: Neg bilat  JOINT MOBILITY: Hip R/L AP: normal joint mobility bilat. Mild concordant pain with RLE.   FUNCTIONAL TESTS:  30 sec STS: 14 reps : 1439' with no pain  GAIT: Distance walked: 10 meters Assistive device utilized: None Level of assistance: Complete Independence Comments: Normal, reciprocal gait pattern                                                                                                                               TREATMENT DATE: 01/27/2024  TA- To improve functional movements patterns for everyday tasks   -AMB overground 1000 ft - min pain noted at end, fast pace noted but not timed   TE- To improve strength, endurance, mobility, and  function of specific targeted muscle groups or improve joint range of motion or improve muscle flexibility  -red physioball rollout x20, then 15 to forward and Left (right low back stretch)   Leg press : x 15 at 40 , 2 x 12 at 55# 1 x 15 @ 55#   NMR: To facilitate reeducation of movement, balance, posture, coordination, and/or proprioception/kinesthetic sense./ TA- To improve functional movements patterns for everyday tasks    -cable paloff press 7.5;lb 1x15 bilat  -RDL 5KG ball from 6 in in duzek87  -cable paloff press 7.5;lb 1x15 bilat  -RDL 5KG ball from 6 in in duzek87   Sidestepping at support bar with GTB 2 x 15 ea direction   Modified side plank 2 x 10 sec ea side ( knee contact instead of foot) on R sid ept had pain in post lat hip musculature she described as  her usual pain)   PATIENT EDUCATION:  Education details: HEP Person educated: Patient Education method: Chief Technology Officer Education comprehension: verbalized understanding and returned demonstration  HOME EXERCISE PROGRAM: Access Code: [email or web address] URL: [email or web address] Date: 12/10/2023 Prepared by: Dorina Kingfisher  Exercises - Supine Lower Trunk Rotation  - 1 x daily - 7 x weekly - 3 sets - 12 reps - Hooklying Single Knee to Chest Stretch  - 1 x daily - 7 x weekly - 1 sets - 3 reps - 30 hold - Supine Posterior Pelvic Tilt  - 1 x daily - 7 x weekly - 3 sets - 12 reps  ASSESSMENT:  CLINICAL IMPRESSION: Continued with current plan of care as laid out in evaluation and recent prior sessions. Pt remains motivated to advance progress toward goals in order to maximize independence and safety at home. Pt had some pain when activating hip abductors with side plank, consider performance on wall or other closed chain intervention to work on this in future visits. Pt requires high level assistance and cuing for completion of exercises in order to provide adequate level of stimulation challenge while  minimizing pain and  discomfort when possible. Pt closely monitored throughout session pt response and to maximize patient safety during interventions. Pt continues to demonstrate progress toward goals AEB progression of interventions this date either in volume or intensity.   OBJECTIVE IMPAIRMENTS: decreased activity tolerance, decreased mobility, difficulty walking, decreased strength, and pain.  ACTIVITY LIMITATIONS: carrying, lifting, bending, and locomotion level PARTICIPATION LIMITATIONS: community activity PERSONAL FACTORS: Age, Fitness, Past/current experiences, and Time since onset of injury/illness/exacerbation are also affecting patient's functional outcome.  REHAB POTENTIAL: Good CLINICAL DECISION MAKING: Stable/uncomplicated EVALUATION COMPLEXITY: Low  GOALS: Goals reviewed with patient? No  SHORT TERM GOALS: Target date: 01/07/24 Pt will be independent with HEP to improve pain and lumbar/hip ROM.  Baseline: 12/10/23: Provided HEP  Goal status: INITIAL  LONG TERM GOALS: Target date: 02/04/24  Pt will improve 30 sec STS test to at least 15 reps for clinically significant improvement in LE strength for age matched norms.  Baseline: 12/10/23: 14 reps Goal status: INITIAL  2.  Pt will improve mODI by at least 6% to demonstrate clinically significant reduction in disability from LBP.  Baseline: 7/50 =14% Goal status: INITIAL  3.  Pt will report full elimination of LBP with functional lifting of loading/unloading son's rollator in/out of car. Baseline: 12/10/23: up to 5/10 NPS for loading/unloading walker. Goal status: INITIAL  PLAN:  PT FREQUENCY: 1-2x/week  PT DURATION: 8 weeks  PLANNED INTERVENTIONS: 97164- PT Re-evaluation, 97750- Physical Performance Testing, 97110-Therapeutic exercises, 97530- Therapeutic activity, [id number]- Neuromuscular re-education, 97535- Self Care, 02859- Manual therapy, [phone]- Gait training, [phone]- Electrical stimulation (unattended), [phone]-  Electrical stimulation (manual), [phone]- Traction (mechanical), Patient/Family education, Balance training, Joint mobilization, Joint manipulation, Spinal manipulation, Spinal mobilization, Cryotherapy, and Moist heat  PLAN FOR NEXT SESSION: Core strength, core strength, and core strength and closed chain hip abduction challenges as indicated, and core strength  8:54 AM, 01/27/24 Note: Portions of this document were prepared using Dragon voice recognition software and although reviewed may contain unintentional dictation errors in syntax, grammar, or spelling.  Lonni KATHEE Gainer PT ,DPT Physical Therapist- [place]  [facility]

## 2024-01-28 ENCOUNTER — Encounter

## 2024-01-29 ENCOUNTER — Ambulatory Visit: Admitting: Physical Therapy

## 2024-02-04 ENCOUNTER — Telehealth: Payer: Self-pay | Admitting: Physical Therapy

## 2024-02-04 ENCOUNTER — Ambulatory Visit: Admitting: Physical Therapy

## 2024-02-04 NOTE — Telephone Encounter (Signed)
 Pt contacted via telephone and author left voice mail informing of missed appointment and informed pt of future PT appointment date and time.    Massie Dollar PT, DPT  Physical Therapist - [facility]  9:54 AM 02/04/24

## 2024-02-11 ENCOUNTER — Ambulatory Visit: Admitting: Physical Therapy

## 2024-02-11 NOTE — Therapy (Deleted)
 OUTPATIENT PHYSICAL THERAPY TREATMENT/ DISCHARGE PT   Patient Name: CAEDYN TASSINARI MRN: [id number] DOB:[birth year], 68 y.o., female Today's Date: 02/11/2024  END OF SESSION:     Past Medical History:  Diagnosis Date   Allergic rhinitis    Arthritis    History of mammogram 2011; 08/29/13; 09/12/14   bibc neg; neg; neg   History of Papanicolaou smear of cervix 04/29/11; 08/29/13   -/-; -/-   Hypertension    Osteoarthritis    Osteopenia    -2.46 z score   Past Surgical History:  Procedure Laterality Date   APPENDECTOMY     AUGMENTATION MAMMAPLASTY Bilateral 1982   Taken out 1995   BILATERAL SALPINGOOPHORECTOMY  2003   COLONOSCOPY  07/2015   FOOT SURGERY  11/2011   HAND SURGERY     thumb   HYSTEROSCOPY  2003   [facility]   LUMBAR DISC SURGERY  2006   SHOULDER ARTHROSCOPY  2008   SCRAPED BURSAL [facility]   Patient Active Problem List   Diagnosis Date Noted   Cervicalgia 01/06/2017   Acute pain of right shoulder 01/06/2017   Hypercholesterolemia 11/19/2016    PCP: Corlis Honor BROCKS., MD  REFERRING PROVIDER: Jud Thom [place], PA-C  REFERRING DIAG: [phone] (ICD-10-CM) - Pain in right hip M54.50 (ICD-10-CM) - Low back pain, unspecified  THERAPY DIAG:  Other low back pain  Pain of right hip joint  Weakness of right leg  Difficulty in walking, not elsewhere classified  Rationale for Evaluation and Treatment: Rehabilitation  ONSET DATE: June 2025  SUBJECTIVE:   SUBJECTIVE STATEMENT: Pt has been sick but is feeling better. Pt has not been to gym due to her and Redell being sick.   PERTINENT HISTORY: Pt reported onset of R hip pain radiating about halfway down the thigh around mid June of 2025. Pt reported that she received an injection in the area in early July, which helped for ~1 week; pain has not been over 5/10 since injection. Pt reports that pain has been 1-2/10 at best, has consistently been 1-2/10. Pt stating that lifting her son's walker, bending over, twisting  aggravates pain. Pain is usually sharp, feels deep. R side in upper hip area, not radiating into the leg since receiving injection. Denies N/T.  PAIN:  Are you having pain? No pain on arrival, but on Nustep has patellafemoral pain 8/10 bilat   PRECAUTIONS: None  WEIGHT BEARING RESTRICTIONS: No  FALLS:  Has patient fallen in last 6 months? No  PATIENT GOALS: prevent pain from increasing again; resume usual activities  NEXT MD VISIT: N/A  OBJECTIVE:   LOWER EXTREMITY MMT:  MMT Right eval Left eval  Hip flexion 4* concordant pain  4  Hip extension    Hip abduction 4 4  Hip adduction    Hip internal rotation 3+ 5  Hip external rotation 4 4  Knee flexion    Knee extension 5 5  Ankle dorsiflexion 5 5  Ankle plantarflexion 15 reps 15 reps  Ankle inversion    Ankle eversion     (Blank rows = not tested)  LOWER EXTREMITY SPECIAL TESTS:  FABER, FADIR neg on L & R Hip Scour R/L: Neg bilat  JOINT MOBILITY: Hip R/L AP: normal joint mobility bilat. Mild concordant pain with RLE.   FUNCTIONAL TESTS:  30 sec STS: 14 reps : 1439' with no pain  GAIT: Distance walked: 10 meters Assistive device utilized: None Level of assistance: Complete Independence Comments: Normal, reciprocal gait pattern  TREATMENT DATE: 02/11/2024  TA- To improve functional movements patterns for everyday tasks   -AMB overground 1000 ft - min pain noted at end, fast pace noted but not timed   TE- To improve strength, endurance, mobility, and function of specific targeted muscle groups or improve joint range of motion or improve muscle flexibility  -red physioball rollout x20, then 15 to forward and Left (right low back stretch)   Leg press : x 15 at 40 , 2 x 12 at 55# 1 x 15 @ 55#   NMR: To facilitate reeducation of movement, balance, posture, coordination, and/or  proprioception/kinesthetic sense./ TA- To improve functional movements patterns for everyday tasks    -cable paloff press 7.5;lb 1x15 bilat  -RDL 5KG ball from 6 in in [id number]  -cable paloff press 7.5;lb 1x15 bilat  -RDL 5KG ball from 6 in in duzek87   Sidestepping at support bar with GTB 2 x 15 ea direction   Modified side plank 2 x 10 sec ea side ( knee contact instead of foot) on R sid ept had pain in post lat hip musculature she described as  her usual pain)   PATIENT EDUCATION:  Education details: HEP Person educated: Patient Education method: Chief Technology Officer Education comprehension: verbalized understanding and returned demonstration  HOME EXERCISE PROGRAM: Access Code: [email or web address] URL: [email or web address] Date: 12/10/2023 Prepared by: Dorina Kingfisher  Exercises - Supine Lower Trunk Rotation  - 1 x daily - 7 x weekly - 3 sets - 12 reps - Hooklying Single Knee to Chest Stretch  - 1 x daily - 7 x weekly - 1 sets - 3 reps - 30 hold - Supine Posterior Pelvic Tilt  - 1 x daily - 7 x weekly - 3 sets - 12 reps  ASSESSMENT:  CLINICAL IMPRESSION: Continued with current plan of care as laid out in evaluation and recent prior sessions. Pt remains motivated to advance progress toward goals in order to maximize independence and safety at home. Pt had some pain when activating hip abductors with side plank, consider performance on wall or other closed chain intervention to work on this in future visits. Pt requires high level assistance and cuing for completion of exercises in order to provide adequate level of stimulation challenge while minimizing pain and discomfort when possible. Pt closely monitored throughout session pt response and to maximize patient safety during interventions. Pt continues to demonstrate progress toward goals AEB progression of interventions this date either in volume or intensity.   OBJECTIVE IMPAIRMENTS: decreased activity tolerance,  decreased mobility, difficulty walking, decreased strength, and pain.  ACTIVITY LIMITATIONS: carrying, lifting, bending, and locomotion level PARTICIPATION LIMITATIONS: community activity PERSONAL FACTORS: Age, Fitness, Past/current experiences, and Time since onset of injury/illness/exacerbation are also affecting patient's functional outcome.  REHAB POTENTIAL: Good CLINICAL DECISION MAKING: Stable/uncomplicated EVALUATION COMPLEXITY: Low  GOALS: Goals reviewed with patient? No  SHORT TERM GOALS: Target date: 01/07/24 Pt will be independent with HEP to improve pain and lumbar/hip ROM.  Baseline: 12/10/23: Provided HEP  Goal status: INITIAL  LONG TERM GOALS: Target date: 02/04/24  Pt will improve 30 sec STS test to at least 15 reps for clinically significant improvement in LE strength for age matched norms.  Baseline: 12/10/23: 14 reps Goal status: INITIAL  2.  Pt will improve mODI by at least 6% to demonstrate clinically significant reduction in disability from LBP.  Baseline: 7/50 =14% Goal status: INITIAL  3.  Pt will report full elimination of LBP with functional lifting of loading/unloading son's  rollator in/out of car. Baseline: 12/10/23: up to 5/10 NPS for loading/unloading walker. Goal status: INITIAL  PLAN:  PT FREQUENCY: 1-2x/week  PT DURATION: 8 weeks  PLANNED INTERVENTIONS: [id number]- PT Re-evaluation, 97750- Physical Performance Testing, 97110-Therapeutic exercises, 97530- Therapeutic activity, [id number]- Neuromuscular re-education, 97535- Self Care, [id number]- Manual therapy, [phone]- Gait training, [phone]- Electrical stimulation (unattended), [phone]- Electrical stimulation (manual), [phone]- Traction (mechanical), Patient/Family education, Balance training, Joint mobilization, Joint manipulation, Spinal manipulation, Spinal mobilization, Cryotherapy, and Moist heat  PLAN FOR NEXT SESSION: Core strength, core strength, and core strength and closed chain hip abduction challenges as  indicated, and core strength  8:25 AM, 02/11/24 Note: Portions of this document were prepared using Dragon voice recognition software and although reviewed may contain unintentional dictation errors in syntax, grammar, or spelling.  Lonni KATHEE Gainer PT ,DPT Physical Therapist- [place]  [facility]

## 2024-02-18 ENCOUNTER — Telehealth: Payer: Self-pay

## 2024-02-18 ENCOUNTER — Ambulatory Visit

## 2024-02-18 NOTE — Telephone Encounter (Signed)
 Pt did not arrive for scheduled appointment. No telephone call nor message preceeded this absence. Author attempted to contact pt via telephone number listed in chart. Pt reports her son is sick today and she had intended to call and cancel but got distracted. Pt reports her symptoms have remains low level and stable, remains mostly improved. She is agreeable to self DC today and informed of how to obtain a new order if her condition exacerbates.   10:14 AM, 02/18/24 Melody Steele, PT, DPT Physical Therapist - [facility]  Outpatient Physical Therapy- Main Campus [phone]

## 2024-02-23 ENCOUNTER — Ambulatory Visit

## 2024-03-01 ENCOUNTER — Ambulatory Visit

## 2024-03-08 ENCOUNTER — Encounter
